# Patient Record
Sex: Male | Born: 1960 | Race: White | Hispanic: No | Marital: Married | Smoking: Never smoker
Health system: Southern US, Community
[De-identification: ages and names within clinical notes are randomized; demographics above are authoritative.]

## PROBLEM LIST (undated history)

## (undated) DIAGNOSIS — G629 Polyneuropathy, unspecified: Secondary | ICD-10-CM

## (undated) DIAGNOSIS — I219 Acute myocardial infarction, unspecified: Secondary | ICD-10-CM

## (undated) DIAGNOSIS — E119 Type 2 diabetes mellitus without complications: Secondary | ICD-10-CM

## (undated) DIAGNOSIS — F32A Depression, unspecified: Secondary | ICD-10-CM

## (undated) DIAGNOSIS — F329 Major depressive disorder, single episode, unspecified: Secondary | ICD-10-CM

## (undated) DIAGNOSIS — E785 Hyperlipidemia, unspecified: Secondary | ICD-10-CM

## (undated) DIAGNOSIS — E0590 Thyrotoxicosis, unspecified without thyrotoxic crisis or storm: Secondary | ICD-10-CM

## (undated) DIAGNOSIS — C8590 Non-Hodgkin lymphoma, unspecified, unspecified site: Secondary | ICD-10-CM

## (undated) DIAGNOSIS — R7989 Other specified abnormal findings of blood chemistry: Secondary | ICD-10-CM

## (undated) DIAGNOSIS — C801 Malignant (primary) neoplasm, unspecified: Secondary | ICD-10-CM

## (undated) HISTORY — DX: Type 2 diabetes mellitus without complications: E11.9

## (undated) HISTORY — PX: BRAIN SURGERY: SHX531

## (undated) HISTORY — DX: Major depressive disorder, single episode, unspecified: F32.9

---

## 2009-10-20 HISTORY — PX: CHOLECYSTECTOMY: SHX55

## 2017-01-22 DIAGNOSIS — R079 Chest pain, unspecified: Secondary | ICD-10-CM | POA: Diagnosis not present

## 2017-01-22 DIAGNOSIS — R55 Syncope and collapse: Secondary | ICD-10-CM | POA: Diagnosis not present

## 2017-01-23 DIAGNOSIS — R55 Syncope and collapse: Secondary | ICD-10-CM | POA: Diagnosis not present

## 2017-01-23 DIAGNOSIS — R079 Chest pain, unspecified: Secondary | ICD-10-CM | POA: Diagnosis not present

## 2017-04-09 ENCOUNTER — Other Ambulatory Visit (HOSPITAL_COMMUNITY)
Admission: RE | Admit: 2017-04-09 | Discharge: 2017-04-09 | Disposition: A | Discharge status: Home / Self Care | Payer: No Typology Code available for payment source | Source: Ambulatory Visit | Attending: Specialist | Admitting: Specialist

## 2017-04-09 DIAGNOSIS — C884 Extranodal marginal zone B-cell lymphoma of mucosa-associated lymphoid tissue [MALT-lymphoma]: Secondary | ICD-10-CM | POA: Diagnosis present

## 2017-04-15 DIAGNOSIS — C8339 Diffuse large B-cell lymphoma, extranodal and solid organ sites: Secondary | ICD-10-CM | POA: Diagnosis not present

## 2017-04-15 DIAGNOSIS — I951 Orthostatic hypotension: Secondary | ICD-10-CM

## 2017-04-21 DIAGNOSIS — R55 Syncope and collapse: Secondary | ICD-10-CM | POA: Diagnosis not present

## 2017-04-21 DIAGNOSIS — I361 Nonrheumatic tricuspid (valve) insufficiency: Secondary | ICD-10-CM | POA: Diagnosis not present

## 2017-04-23 ENCOUNTER — Ambulatory Visit: Payer: No Typology Code available for payment source | Admitting: Neurology

## 2017-04-23 ENCOUNTER — Encounter (HOSPITAL_COMMUNITY): Payer: Self-pay

## 2017-04-24 DIAGNOSIS — C8339 Diffuse large B-cell lymphoma, extranodal and solid organ sites: Secondary | ICD-10-CM | POA: Diagnosis not present

## 2017-04-30 LAB — TISSUE HYBRIDIZATION TO NCBH

## 2017-05-02 DIAGNOSIS — D709 Neutropenia, unspecified: Secondary | ICD-10-CM

## 2017-05-02 DIAGNOSIS — C8339 Diffuse large B-cell lymphoma, extranodal and solid organ sites: Secondary | ICD-10-CM | POA: Diagnosis not present

## 2017-05-02 DIAGNOSIS — E785 Hyperlipidemia, unspecified: Secondary | ICD-10-CM

## 2017-05-02 DIAGNOSIS — E1142 Type 2 diabetes mellitus with diabetic polyneuropathy: Secondary | ICD-10-CM | POA: Diagnosis not present

## 2017-05-02 DIAGNOSIS — R000 Tachycardia, unspecified: Secondary | ICD-10-CM

## 2017-05-02 DIAGNOSIS — D701 Agranulocytosis secondary to cancer chemotherapy: Secondary | ICD-10-CM | POA: Diagnosis not present

## 2017-05-02 DIAGNOSIS — I10 Essential (primary) hypertension: Secondary | ICD-10-CM

## 2017-05-02 DIAGNOSIS — C8333 Diffuse large B-cell lymphoma, intra-abdominal lymph nodes: Secondary | ICD-10-CM

## 2017-05-03 DIAGNOSIS — D709 Neutropenia, unspecified: Secondary | ICD-10-CM | POA: Diagnosis not present

## 2017-05-03 DIAGNOSIS — E1142 Type 2 diabetes mellitus with diabetic polyneuropathy: Secondary | ICD-10-CM | POA: Diagnosis not present

## 2017-05-03 DIAGNOSIS — I10 Essential (primary) hypertension: Secondary | ICD-10-CM | POA: Diagnosis not present

## 2017-05-03 DIAGNOSIS — C8333 Diffuse large B-cell lymphoma, intra-abdominal lymph nodes: Secondary | ICD-10-CM | POA: Diagnosis not present

## 2017-05-03 DIAGNOSIS — E785 Hyperlipidemia, unspecified: Secondary | ICD-10-CM | POA: Diagnosis not present

## 2017-05-04 DIAGNOSIS — I10 Essential (primary) hypertension: Secondary | ICD-10-CM | POA: Diagnosis not present

## 2017-05-04 DIAGNOSIS — E785 Hyperlipidemia, unspecified: Secondary | ICD-10-CM | POA: Diagnosis not present

## 2017-05-04 DIAGNOSIS — D709 Neutropenia, unspecified: Secondary | ICD-10-CM | POA: Diagnosis not present

## 2017-05-04 DIAGNOSIS — C8333 Diffuse large B-cell lymphoma, intra-abdominal lymph nodes: Secondary | ICD-10-CM | POA: Diagnosis not present

## 2017-05-05 DIAGNOSIS — C8339 Diffuse large B-cell lymphoma, extranodal and solid organ sites: Secondary | ICD-10-CM | POA: Diagnosis not present

## 2017-05-05 DIAGNOSIS — I10 Essential (primary) hypertension: Secondary | ICD-10-CM | POA: Diagnosis not present

## 2017-05-05 DIAGNOSIS — D709 Neutropenia, unspecified: Secondary | ICD-10-CM | POA: Diagnosis not present

## 2017-05-05 DIAGNOSIS — E785 Hyperlipidemia, unspecified: Secondary | ICD-10-CM | POA: Diagnosis not present

## 2017-05-05 DIAGNOSIS — D701 Agranulocytosis secondary to cancer chemotherapy: Secondary | ICD-10-CM

## 2017-05-05 DIAGNOSIS — C8333 Diffuse large B-cell lymphoma, intra-abdominal lymph nodes: Secondary | ICD-10-CM | POA: Diagnosis not present

## 2017-05-14 DIAGNOSIS — C8339 Diffuse large B-cell lymphoma, extranodal and solid organ sites: Secondary | ICD-10-CM | POA: Diagnosis not present

## 2017-05-14 DIAGNOSIS — G620 Drug-induced polyneuropathy: Secondary | ICD-10-CM | POA: Diagnosis not present

## 2017-05-20 ENCOUNTER — Ambulatory Visit (INDEPENDENT_AMBULATORY_CARE_PROVIDER_SITE_OTHER): Payer: BLUE CROSS/BLUE SHIELD | Admitting: Neurology

## 2017-05-20 ENCOUNTER — Encounter: Payer: Self-pay | Admitting: Neurology

## 2017-05-20 VITALS — BP 138/79 | HR 85 | Ht 74.0 in | Wt 227.0 lb

## 2017-05-20 DIAGNOSIS — R42 Dizziness and giddiness: Secondary | ICD-10-CM | POA: Diagnosis not present

## 2017-05-20 DIAGNOSIS — Z87898 Personal history of other specified conditions: Secondary | ICD-10-CM | POA: Diagnosis not present

## 2017-05-20 DIAGNOSIS — C8330 Diffuse large B-cell lymphoma, unspecified site: Secondary | ICD-10-CM

## 2017-05-20 NOTE — Patient Instructions (Signed)
Unfortunately, dizziness is a very common complaint but is often not due to a primary neurological reason or single underlying medical problem. Often, there a combination of factors, that result in dizziness. This includes blood pressure fluctuations, medication side effects, blood sugar fluctuations, stress, vertigo, poor sleep with sleep deprivation, dehydration, and electrolyte disturbance or other metabolic and endocrinological reasons, meaning hormone related problems such as thyroid dysfunction. We will investigate things further with a brain MRI with contrast. We will call you with the test results. You have evidence of peripheral neuropathy. Good diabetes control is going to be key for your, given, that you also have to be on steroids for your chemo.  Gabapentin can cause balance issues, keep that in mind.

## 2017-05-20 NOTE — Progress Notes (Signed)
Subjective:    Patient ID: Randy Carson is a 56 y.o. male.  HPI     Star Age, MD, PhD [facility] [address] [place], [place] [address]  Dear Dr. Tomma Lightning,   I saw your patient, Randy Carson, upon your kind request in my neurologic clinic today for initial consultation of his dizziness. The patient is accompanied by his wife today. 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 He does not necessarily feel a spinning sensation. He does not always feel lightheaded either. I reviewed your office note from 03/05/2017, which you kindly included. He was having high blood pressure values, recurrence of depression and anxiety. He was restarted on Zoloft at the time. You ordered a brain MRI. He had a brain MRI without contrast on 02/27/2017 which I reviewed: Impression: Age advanced but nonspecific FLAIR hyperintensity in the subcortical cerebral white matter. Differential considerations include sequelae of trauma, hypercoagulable state, vasculitis, prior infection, demyelination, or accelerated small vessel ischemia. Otherwise normal noncontrast MRI of the brain. He had a recent surgical consultation at [facility] with Dr. Noberto Retort for port placement for his chemotherapy. He had a PET CT FDG skull to thigh which showed through [facility] on 04/17/2017 which I reviewed: IMPRESSION: 1. Large intensely hypermetabolic invasive mass centered along the body of the stomach consistent with lymphoma. 2. Mass involves adjacent structures including the spleen, LEFT adrenal gland,  potentially LEFT kidney, pancreas, porta hepatis, LEFT crus of the diaphragm, and potentially RIGHT adrenal gland 3. No distant hypermetabolic adenopathy. 4. No evidence of skeletal involvement. 5. Moderate LEFT effusion  He had passing out spell on 01/22/17. He was seen by ENT and had a checkup for vertigo. He does admit that his blood sugar control is not very good, last A1c as far as he can remember was around 9.1. He does drink sweet tea several cups per day. Also coffee in the morning. He tries to drink enough water. He does not sleep very well. He snores. He does not wish to pursue a sleep study although I recommended sleep study testing for concern for sleep apnea. He does not wish to have a sleep study and he does not wish to have a CPAP machine. When he was 56 years old he was diagnosed with a brain tumor, could be a sinus tumor as the skull remains intact as I understand. He did have surgery at age 89 but no problems since then, no history of developmental delays. He has had 2 rounds of chemotherapy, total of 6 treatments planned every 3 weeks. He does have to take prednisone high dose for 5 days around his chemotherapy. He has a history of peripheral neuropathy secondary to diabetes and worsening symptoms recently after starting chemotherapy and his oncologist started him on gabapentin which patient feels has been helpful. He takes 300 mg 3 times a day. He was tried on meclizine which she did not think it was helpful.  His Past Medical History Is Significant For: Past Medical History:  Diagnosis Date  . Diabetes ([place])   . Major depressive disorder     His Past Surgical History Is Significant For: No past surgical  history on file.  His Family History Is Significant For: No family history on file.  His Social History Is Significant For: Social History   Social History  . Marital status: Married    Spouse name: N/A  . Number of children: N/A  . Years of education: N/A   Social  History Main Topics  . Smoking status: Never Smoker  . Smokeless tobacco: Never Used  . Alcohol use Yes  . Drug use: No  . Sexual activity: Not Asked   Other Topics Concern  . None   Social History Narrative  . None    His Allergies Are:  Allergies  Allergen Reactions  . Rosiglitazone Other (See Comments)  :   His Current Medications Are:  Outpatient Encounter Prescriptions as of 05/20/2017  Medication Sig  . amoxicillin-clavulanate (AUGMENTIN) 875-125 MG tablet Take 1 tablet by mouth 2 (two) times daily.  [place] Kitchen aspirin EC 81 MG tablet Take 81 mg by mouth daily.  . clotrimazole (LOTRIMIN) 1 % cream Apply 1 application topically 2 (two) times daily.  . fluconazole (DIFLUCAN) 100 MG tablet Take 100 mg by mouth daily.  Mar[place] Kitchen gabapentin (NEURONTIN) 300 MG capsule Take 300 mg by mouth 3 (three) times daily.  . insulin glargine (LANTUS) 100 UNIT/ML injection Inject 15 Units into the skin 2 (two) times daily.  [place] Kitchen lisinopril (PRINIVIL,ZESTRIL) 10 MG tablet Take 10 mg by mouth daily.  [place] Kitchen loratadine (CLARITIN) 10 MG tablet Take 10 mg by mouth daily.  [place] Kitchen lovastatin (MEVACOR) 20 MG tablet Take 20 mg by mouth at bedtime.  . metoprolol tartrate (LOPRESSOR) 25 MG tablet Take 25 mg by mouth 2 (two) times daily.  [place] Kitchen omeprazole (PRILOSEC) 20 MG capsule Take 20 mg by mouth 2 (two) times daily before a meal.  . PREDNISONE PO Take by mouth.  . sertraline (ZOLOFT) 50 MG tablet Take 50 mg by mouth daily.  . [DISCONTINUED] aspirin 325 MG tablet Take 325 mg by mouth daily.  . [DISCONTINUED] glipiZIDE (GLUCOTROL) 10 MG tablet Take 10 mg by mouth 2 (two) times daily before a meal.  . [DISCONTINUED] meclizine (ANTIVERT) 25 MG tablet Take 25 mg by mouth 3 (three) times daily as needed for dizziness.  . [DISCONTINUED] metFORMIN (GLUCOPHAGE) 1000 MG tablet Take 1,000 mg by mouth 2 (two) times daily with a meal.   No facility-administered encounter medications on file as of 05/20/2017.   :  Review of Systems:  Out of a  complete 14 point review of systems, all are reviewed and negative with the exception of these symptoms as listed below: Review of Systems  Neurological:       Pt presents today to discuss his dizziness. Pt experiences dizziness when he is standing or walking for a long amount of time. Pt brought an imaging CD with him to be reviewed if necessary. Pt was recently diagnosed with non-hodgkin's lymphoma.    Objective:  Neurological Exam  Physical Exam Physical Examination:   Vitals:   05/20/17 1011 05/20/17 1012  BP: 113/76 138/79  Pulse: (!) 102 85   General Examination: The patient is a very pleasant 56 y.o. male in no acute distress. He appears well-developed and well-nourished and adequately groomed. He did not have any orthostatic blood pressure drop or significant changes in his symptoms.  HEENT: Bony indentation in the forehead, with scarring above nasal bridge and along both eyebrows. Large concave deformity but skull bone is palpable underneath the lower frontal area. Pupils are equal, round and reactive to light  and accommodation. Extraocular tracking is good without limitation to gaze excursion or nystagmus noted. Normal smooth pursuit is noted. Hearing is grossly intact. Face is symmetric with normal facial animation and normal facial sensation. Speech is clear with no dysarthria noted. There is no hypophonia. There is no lip, neck/head, jaw or voice tremor. Neck is supple with full range of passive and active motion. There are no carotid bruits on auscultation. Oropharynx exam reveals: moderate mouth dryness, poor dental hygiene with multiple missing teeth, moderate airway crowding noted. Mallampati is class II. Tongue protrudes centrally and palate elevates symmetrically.   Chest: Clear to auscultation without wheezing, rhonchi or crackles noted.  Heart: S1+S2+0, regular and normal without murmurs, rubs or gallops noted.   Abdomen: Soft, non-tender and non-distended with normal  bowel sounds appreciated on auscultation.  Extremities: There is no pitting edema in the distal lower extremities bilaterally. Pedal pulses are intact.  Skin: Warm and dry without trophic changes noted. Patchy rash noted medial aspect of the left calf.  Musculoskeletal: exam reveals no obvious joint deformities, tenderness or joint swelling or erythema. Reports low back pain. Status post knee surgeries bilaterally, left knee slightly larger than right.  Neurologically:  Mental status: The patient is awake, alert and oriented in all 4 spheres. His immediate and remote memory, attention, language skills and fund of knowledge are appropriate. There is no evidence of aphasia, agnosia, apraxia or anomia. Speech is clear with normal prosody and enunciation. Thought process is linear. Mood is normal and affect is normal.  Cranial nerves II - XII are as described above under HEENT exam. In addition: shoulder shrug is normal with equal shoulder height noted. Motor exam: Normal bulk, strength and tone is noted. There is no drift, tremor or rebound. Romberg is negative, with the exception of sway. Reflexes are 1+ in the upper extremities, 2+ both knees, absent in both ankles. Toes are flexor bilaterally. Fine motor skills and coordination: intact with normal finger taps, normal hand movements, normal rapid alternating patting, normal foot taps and normal foot agility.  Cerebellar testing: No dysmetria or intention tremor on finger to nose testing. Heel to shin is unremarkable bilaterally. There is no truncal or gait ataxia.  Sensory exam: intact to light touch, pinprick, vibration, temperature sense in the upper extremities and proximal lower extremities but decrease to all modalities, right more than left in the distal lower extremities bilaterally, up to knees.   Gait, station and balance: He stands with difficulty. No veering to one side is noted. No leaning to one side is noted. Posture is age-appropriate and  stance is narrow based. he walks slowly, no limp, tandem walk is difficult for him.   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 Neurological exam is nonfocal with the exception of evidence of peripheral neuropathy, likely diabetic. He does report worsening of neuropathy symptoms since starting chemotherapy. He does have a history of poorly controlled diabetes, last A1c per his report was above 9. Especially in light of his cancer treatment involving high-dose prednisone, it is critical that he have optimal diabetes control. He is strongly advised to abstain from any sugary drinks or dietary  indiscretions. He is advised to stable hydrated. He declines coming in for a sleep study for concern for underlying obstructive sleep apnea, he does not wish to have a study or consider CPAP therapy. As far as the dizziness, this is a nonspecific complaints, I do not see any other focality on exam to explain his dizzy spell from the neurological standpoint. Nevertheless, given his prior cancer history or sinus tumor history as a child and recent diagnosis of lymphoma I would recommend a brain MRI with contrast. He had a noncontrasted MRI in May 2018 which did not show any focal neurological findings. We will call him with his MRI results once these are available. He is requesting [facility] in [place]. From my end of things I suggested as needed follow-up so long as the MRI is nonrevealing. I answered all their questions today and the patient and his wife were in agreement.  Thank you very much for allowing me to participate in  the care of this nice patient. If I can be of any further assistance to you please do not hesitate to call me at [phone].  Sincerely,   Star Age, MD, PhD

## 2017-05-25 ENCOUNTER — Telehealth: Payer: Self-pay | Admitting: Neurology

## 2017-05-25 NOTE — Telephone Encounter (Signed)
I was able to get the approval for the MRI brain w contrast but when I called [facility] to schedule the MRI they informed me per radiology  protocol in order to do a MRI brain w contrast the MRI Brain wo contrast had to be in the last 30 days. And since it has been longer than 30 days it needs to be a MRI Brain w/wo contrast. When you get a chance can you switch the order to a MRI brain w/wo contrast. Thank you

## 2017-05-25 NOTE — Addendum Note (Signed)
Addended by: Star Age on: 05/25/2017 11:39 AM   Modules accepted: Orders

## 2017-06-02 ENCOUNTER — Telehealth: Payer: Self-pay | Admitting: Neurology

## 2017-06-02 NOTE — Telephone Encounter (Signed)
Please advise patient that I have reviewed his brain MRI results from 05/26/2017. He had a brain MRI with and without contrast at [facility] in [place]. Findings were reported to be stable. He has no abnormal contrast uptake and no abnormal mass or finding. No acute stroke, no blood products, overall stable findings and in comparison with his brain MRI without contrast from May 2018 no new findings, overall reassuring.

## 2017-06-03 NOTE — Telephone Encounter (Signed)
I called pt. I advised him that his MRI brain showed stable findings, no abnormal contrast uptake and no abnormal stable findings and in comparison with his brain MRI without contrast from May 2018, there was no new findings which is overall reassuring. Pt verbalized understanding of results. Pt had no questions at this time but was encouraged to call back if questions arise.

## 2017-06-25 DIAGNOSIS — C8339 Diffuse large B-cell lymphoma, extranodal and solid organ sites: Secondary | ICD-10-CM | POA: Diagnosis not present

## 2017-07-17 DIAGNOSIS — C8339 Diffuse large B-cell lymphoma, extranodal and solid organ sites: Secondary | ICD-10-CM | POA: Diagnosis not present

## 2017-08-07 DIAGNOSIS — C8339 Diffuse large B-cell lymphoma, extranodal and solid organ sites: Secondary | ICD-10-CM | POA: Diagnosis not present

## 2017-09-17 DIAGNOSIS — C8339 Diffuse large B-cell lymphoma, extranodal and solid organ sites: Secondary | ICD-10-CM | POA: Diagnosis not present

## 2017-09-17 DIAGNOSIS — Z9221 Personal history of antineoplastic chemotherapy: Secondary | ICD-10-CM | POA: Diagnosis not present

## 2017-12-21 ENCOUNTER — Ambulatory Visit (INDEPENDENT_AMBULATORY_CARE_PROVIDER_SITE_OTHER): Payer: Non-veteran care | Admitting: Podiatry

## 2017-12-21 DIAGNOSIS — Q828 Other specified congenital malformations of skin: Secondary | ICD-10-CM | POA: Diagnosis not present

## 2017-12-21 DIAGNOSIS — B351 Tinea unguium: Secondary | ICD-10-CM

## 2017-12-21 DIAGNOSIS — E1142 Type 2 diabetes mellitus with diabetic polyneuropathy: Secondary | ICD-10-CM

## 2018-01-17 NOTE — Progress Notes (Signed)
Subjective:  Patient ID: Randy Carson, male    DOB: June 16, 1961,  MRN: [id number]  Chief Complaint  Patient presents with  . Diabetes    diabetic foot exam   . Nail Problem    fungal nails that are turning black   . Peripheral Neuropathy    neuropathy in both legs and feet   . Callouses    porokeratosis vs wart left heel     57 y.o. male presents with the above complaint.  Reports pain to the bottom of the left foot with these hard callused areas.  Reports neuropathy up to the legs.  Reports changes in his nails.  Unsure of last a.m. blood sugar.  Denies other pedal issues.  Denies nausea vomiting fever chills  Past Medical History:  Diagnosis Date  . Diabetes ([place])   . Major depressive disorder     Current Outpatient Medications:  .  aspirin EC 81 MG tablet, Take 81 mg by mouth daily., Disp: , Rfl:  .  gabapentin (NEURONTIN) 300 MG capsule, Take 300 mg by mouth 3 (three) times daily., Disp: , Rfl: 0 .  insulin glargine (LANTUS) 100 UNIT/ML injection, Inject 15 Units into the skin 2 (two) times daily., Disp: , Rfl:  .  lovastatin (MEVACOR) 20 MG tablet, Take 20 mg by mouth at bedtime., Disp: , Rfl:  .  metFORMIN (GLUCOPHAGE) 1000 MG tablet, Take by mouth., Disp: , Rfl:  .  metoprolol tartrate (LOPRESSOR) 25 MG tablet, Take 25 mg by mouth 2 (two) times daily., Disp: , Rfl:  .  omeprazole (PRILOSEC) 20 MG capsule, Take 20 mg by mouth 2 (two) times daily before a meal., Disp: , Rfl:  .  sertraline (ZOLOFT) 50 MG tablet, Take 50 mg by mouth daily., Disp: , Rfl:  .  amoxicillin-clavulanate (AUGMENTIN) 875-125 MG tablet, Take 1 tablet by mouth 2 (two) times daily., Disp: , Rfl:  .  clotrimazole (LOTRIMIN) 1 % cream, Apply 1 application topically 2 (two) times daily., Disp: , Rfl:  .  fluconazole (DIFLUCAN) 100 MG tablet, Take 100 mg by mouth daily., Disp: , Rfl:  .  lisinopril (PRINIVIL,ZESTRIL) 10 MG tablet, Take 10 mg by mouth daily., Disp: , Rfl:  .  loratadine (CLARITIN) 10  MG tablet, Take 10 mg by mouth daily., Disp: , Rfl:  .  PREDNISONE PO, Take by mouth., Disp: , Rfl:   Allergies  Allergen Reactions  . Rosiglitazone Other (See Comments)   Review of Systems Objective:   General AA&O x3. Normal mood and affect.  Vascular Dorsalis pedis pulses present 1+ bilaterally  Posterior tibial pulses 1+ bilaterally  Capillary refill normal to all digits. Pedal hair growth normal.  Neurologic Epicritic sensation present bilaterally. Protective sensation with 5.07 monofilament  Absent bilaterally. Vibratory sensation present bilaterally.  Dermatologic No open lesions. Interspaces clear of maceration.  Normal skin temperature and turgor. Hyperkeratotic lesions: L 5th met, R 2nd met, L heel Nails: brittle, onychomycosis, thickening, elongation  Orthopedic: No history of amputation. MMT 5/5 in dorsiflexion, plantarflexion, inversion, and eversion. Normal lower extremity joint ROM without pain or crepitus.     Assessment & Plan:  Patient was evaluated and treated and all questions answered.  DM with DPN, onychomycosis, porokeratosis -Educated on DM foot care -At risk foot care provider as below  Procedure: Paring of Lesion Rationale: painful hyperkeratotic lesion Type of Debridement: manual, sharp debridement. Instrumentation: 312 blade Number of Lesions: 3  Procedure: Nail Debridement Rationale: Patient meets criteria for routine foot care  due to 1 Class B, 2 class C findings. Type of Debridement: manual, sharp debridement. Instrumentation: Nail nipper, rotary burr. Number of Nails: 10     Return in about 6 weeks (around 02/01/2018).

## 2018-02-01 ENCOUNTER — Ambulatory Visit (INDEPENDENT_AMBULATORY_CARE_PROVIDER_SITE_OTHER): Payer: Non-veteran care | Admitting: Podiatry

## 2018-02-01 DIAGNOSIS — E1142 Type 2 diabetes mellitus with diabetic polyneuropathy: Secondary | ICD-10-CM

## 2018-02-01 DIAGNOSIS — B351 Tinea unguium: Secondary | ICD-10-CM

## 2018-02-01 DIAGNOSIS — L309 Dermatitis, unspecified: Secondary | ICD-10-CM

## 2018-02-01 MED ORDER — TRIAMCINOLONE ACETONIDE 0.1 % EX CREA: TOPICAL_CREAM | CUTANEOUS | 0 refills | Status: DC

## 2018-02-01 NOTE — Progress Notes (Signed)
  Subjective:  Patient ID: Randy Carson, male    DOB: Mar 08, 1961,  MRN: [id number]  Chief Complaint  Patient presents with  . foot exam    DM foot exam -sugar: 150 A1C: " don;t know" [facility]  . Plantar Warts    F/U L bottom heel wart Pt. stated," no pain so it's doing much better." Tx: none   57 y.o. male returns for diabetic foot care. Last AMBS was 132.   New complaint of rash to the left leg Objective:  There were no vitals filed for this visit. General AA&O x3. Normal mood and affect.  Vascular Dorsalis pedis pulses present 2+ bilaterally  Posterior tibial pulses present 1+ bilaterally  Capillary refill normal to all digits. Pedal hair growth diminished.  Neurologic Epicritic sensation present bilaterally. Protective sensation with 5.07 monofilament  absent bilaterally.  Dermatologic  Pruritic vesicular lesion L leg Interspaces clear of maceration.  Normal skin temperature and turgor. Hyperkeratotic lesions: none bilaterally. Nails: brittle, irregular, wavy nails, onychomycosis, thickening  Orthopedic: No history of amputation. MMT 5/5 in dorsiflexion, plantarflexion, inversion, and eversion. Normal lower extremity joint ROM without pain or crepitus.   Assessment & Plan:  Patient was evaluated and treated and all questions answered.  Diabetes with DPN, Onychomycosis -Educated on diabetic footcare. Diabetic risk level 1 -At risk foot care provided as below.  Procedure: Nail Debridement Rationale: Patient meets criteria for routine foot care due to 1 class B, 2 Class C findings. Type of Debridement: manual, sharp debridement. Instrumentation: Nail nipper, rotary burr. Number of Nails: 10  L Leg Pruritic Rash -Rx Triamcinolone cream.  Return in about 6 weeks (around 03/15/2018).

## 2018-03-22 ENCOUNTER — Ambulatory Visit (INDEPENDENT_AMBULATORY_CARE_PROVIDER_SITE_OTHER): Payer: No Typology Code available for payment source | Admitting: Podiatry

## 2018-03-22 ENCOUNTER — Encounter: Payer: Self-pay | Admitting: Podiatry

## 2018-03-22 DIAGNOSIS — L309 Dermatitis, unspecified: Secondary | ICD-10-CM | POA: Diagnosis not present

## 2018-03-22 MED ORDER — TRIAMCINOLONE ACETONIDE 0.1 % EX CREA: TOPICAL_CREAM | CUTANEOUS | 0 refills | Status: AC

## 2018-03-22 MED ORDER — TRIAMCINOLONE ACETONIDE 0.1 % EX CREA: TOPICAL_CREAM | CUTANEOUS | 0 refills | Status: DC

## 2018-04-18 NOTE — Progress Notes (Signed)
  Subjective:  Patient ID: Randy Carson, male    DOB: 1961-05-11,  MRN: [id number]  Chief Complaint  Patient presents with  . Rash    left inner leg, forgot to pick up prescription, please resend,no changes in rash   57 y.o. male returns for follow-up.  States that the same has not picked up his prescription. Objective:  There were no vitals filed for this visit. General AA&O x3. Normal mood and affect.  Vascular Dorsalis pedis pulses present 2+ bilaterally  Posterior tibial pulses present 1+ bilaterally  Capillary refill normal to all digits. Pedal hair growth diminished.  Neurologic Epicritic sensation present bilaterally. Protective sensation with 5.07 monofilament  absent bilaterally.  Dermatologic Pruritic vesicular lesion L leg Interspaces clear of maceration.  Normal skin temperature and turgor. Hyperkeratotic lesions: none bilaterally. Nails: brittle, irregular, wavy nails, onychomycosis, thickening  Orthopedic: No history of amputation. MMT 5/5 in dorsiflexion, plantarflexion, inversion, and eversion. Normal lower extremity joint ROM without pain or crepitus.   Assessment & Plan:  Patient was evaluated and treated and all questions answered.   L Leg Pruritic Rash -Refill Triamcinolone cream.  Return in about 1 month (around 04/19/2018).

## 2018-04-19 ENCOUNTER — Ambulatory Visit (INDEPENDENT_AMBULATORY_CARE_PROVIDER_SITE_OTHER): Payer: No Typology Code available for payment source | Admitting: Podiatry

## 2018-04-19 ENCOUNTER — Telehealth: Payer: Self-pay | Admitting: Podiatry

## 2018-04-19 DIAGNOSIS — B353 Tinea pedis: Secondary | ICD-10-CM | POA: Diagnosis not present

## 2018-04-19 DIAGNOSIS — B351 Tinea unguium: Secondary | ICD-10-CM | POA: Diagnosis not present

## 2018-04-19 DIAGNOSIS — E1142 Type 2 diabetes mellitus with diabetic polyneuropathy: Secondary | ICD-10-CM | POA: Diagnosis not present

## 2018-04-19 MED ORDER — KETOCONAZOLE 2 % EX CREA: TOPICAL_CREAM | CUTANEOUS | 0 refills | Status: DC

## 2018-04-19 NOTE — Telephone Encounter (Signed)
Called Rx to [facility] pharmacy. I attempted to call the number pt left for me, but there was only a busy signal when dialed. I looked up online the information below and went ahead and called in ketoconazole cream 2% as requested. Informed pt-L/M   [place] is [place], , [place], [place] number is [phone] and fax number is [phone]

## 2018-04-19 NOTE — Progress Notes (Signed)
  Subjective:  Patient ID: Randy Carson, male    DOB: [birth year],  MRN: [id number]  Chief Complaint  Patient presents with  . debride    B/L nail trimming -FBS: 152 A1C: NA [facility]  . Rash    F/U L leg rash Pt. states," tx is not helping at all, it looks the same." Tx triamcinolone cream   57 y.o. male returns for diabetic foot care. Last AMBS was 152. No improvement with the cream on the rash to the L leg. Objective:  There were no vitals filed for this visit. General AA&O x3. Normal mood and affect.  Vascular Dorsalis pedis pulses present 2+ bilaterally  Posterior tibial pulses present 1+ bilaterally  Capillary refill normal to all digits. Pedal hair growth diminished.  Neurologic Epicritic sensation present bilaterally. Protective sensation with 5.07 monofilament  absent bilaterally.  Dermatologic Annular lesion L leg Interspaces clear of maceration.  Normal skin temperature and turgor. Hyperkeratotic lesions: none bilaterally. Nails: brittle, irregular, wavy nails, onychomycosis, thickening  Orthopedic: No history of amputation. MMT 5/5 in dorsiflexion, plantarflexion, inversion, and eversion. Normal lower extremity joint ROM without pain or crepitus.   Assessment & Plan:  Patient was evaluated and treated and all questions answered.  Diabetes with DPN, Onychomycosis -Educated on diabetic footcare. Diabetic risk level 1 -At risk foot care provided as below.  Procedure: Nail Debridement Rationale: Patient meets criteria for routine foot care due to 1 Class B, 2 class C findings. Type of Debridement: manual, sharp debridement. Instrumentation: Nail nipper, rotary burr. Number of Nails: 10   L Leg Ringworm -Rx Ketoconazole.  Return in about 3 months (around 07/20/2018) for Diabetic Foot Care.

## 2018-04-19 NOTE — Telephone Encounter (Signed)
Pt called and stated that the antifungal cream that was sent over to [facility] cost to much. Pt is currently out of work. He wanted to know could the medication be sent over to the [facility] in [place]. Please give pt a call.   [address], [place], [place] [address]  Phone [phone]

## 2018-04-26 ENCOUNTER — Telehealth: Payer: Self-pay | Admitting: *Deleted

## 2018-04-26 MED ORDER — KETOCONAZOLE 2 % EX CREA: TOPICAL_CREAM | CUTANEOUS | 0 refills | Status: AC

## 2018-04-26 NOTE — Telephone Encounter (Signed)
Pt request anti-fungal medication be faxed to the [facility] [phone]. Faxed Ketoconazole cream 2% to [facility].

## 2018-05-18 ENCOUNTER — Telehealth: Payer: Self-pay | Admitting: Podiatry

## 2018-05-18 NOTE — Telephone Encounter (Signed)
[facility] in March,  faxed to [phone].

## 2018-07-20 ENCOUNTER — Ambulatory Visit: Payer: Non-veteran care | Admitting: Podiatry

## 2018-10-11 ENCOUNTER — Inpatient Hospital Stay (HOSPITAL_COMMUNITY): Payer: No Typology Code available for payment source

## 2018-10-11 ENCOUNTER — Emergency Department (HOSPITAL_COMMUNITY): Payer: No Typology Code available for payment source

## 2018-10-11 ENCOUNTER — Inpatient Hospital Stay (HOSPITAL_COMMUNITY)
Admission: EM | Admit: 2018-10-11 | Discharge: 2018-12-02 | DRG: 082 | Disposition: A | Discharge status: Home Health | Payer: No Typology Code available for payment source | Attending: General Surgery | Admitting: General Surgery

## 2018-10-11 DIAGNOSIS — S065X9A Traumatic subdural hemorrhage with loss of consciousness of unspecified duration, initial encounter: Principal | ICD-10-CM | POA: Diagnosis present

## 2018-10-11 DIAGNOSIS — S8010XA Contusion of unspecified lower leg, initial encounter: Secondary | ICD-10-CM | POA: Diagnosis present

## 2018-10-11 DIAGNOSIS — S2220XA Unspecified fracture of sternum, initial encounter for closed fracture: Secondary | ICD-10-CM | POA: Diagnosis present

## 2018-10-11 DIAGNOSIS — Z79890 Hormone replacement therapy (postmenopausal): Secondary | ICD-10-CM

## 2018-10-11 DIAGNOSIS — E1165 Type 2 diabetes mellitus with hyperglycemia: Secondary | ICD-10-CM | POA: Diagnosis present

## 2018-10-11 DIAGNOSIS — S069X3S Unspecified intracranial injury with loss of consciousness of 1 hour to 5 hours 59 minutes, sequela: Secondary | ICD-10-CM | POA: Diagnosis not present

## 2018-10-11 DIAGNOSIS — R402122 Coma scale, eyes open, to pain, at arrival to emergency department: Secondary | ICD-10-CM | POA: Diagnosis present

## 2018-10-11 DIAGNOSIS — R402212 Coma scale, best verbal response, none, at arrival to emergency department: Secondary | ICD-10-CM | POA: Diagnosis present

## 2018-10-11 DIAGNOSIS — D62 Acute posthemorrhagic anemia: Secondary | ICD-10-CM | POA: Diagnosis present

## 2018-10-11 DIAGNOSIS — I10 Essential (primary) hypertension: Secondary | ICD-10-CM | POA: Diagnosis present

## 2018-10-11 DIAGNOSIS — Z4659 Encounter for fitting and adjustment of other gastrointestinal appliance and device: Secondary | ICD-10-CM

## 2018-10-11 DIAGNOSIS — S02119A Unspecified fracture of occiput, initial encounter for closed fracture: Secondary | ICD-10-CM | POA: Diagnosis present

## 2018-10-11 DIAGNOSIS — R633 Feeding difficulties: Secondary | ICD-10-CM | POA: Diagnosis not present

## 2018-10-11 DIAGNOSIS — R402424 Glasgow coma scale score 9-12, 24 hours or more after hospital admission: Secondary | ICD-10-CM | POA: Diagnosis not present

## 2018-10-11 DIAGNOSIS — Z888 Allergy status to other drugs, medicaments and biological substances status: Secondary | ICD-10-CM

## 2018-10-11 DIAGNOSIS — Z85841 Personal history of malignant neoplasm of brain: Secondary | ICD-10-CM | POA: Diagnosis not present

## 2018-10-11 DIAGNOSIS — E785 Hyperlipidemia, unspecified: Secondary | ICD-10-CM | POA: Diagnosis present

## 2018-10-11 DIAGNOSIS — R509 Fever, unspecified: Secondary | ICD-10-CM

## 2018-10-11 DIAGNOSIS — R4020 Unspecified coma: Secondary | ICD-10-CM | POA: Diagnosis present

## 2018-10-11 DIAGNOSIS — Z781 Physical restraint status: Secondary | ICD-10-CM | POA: Diagnosis not present

## 2018-10-11 DIAGNOSIS — N390 Urinary tract infection, site not specified: Secondary | ICD-10-CM | POA: Diagnosis not present

## 2018-10-11 DIAGNOSIS — E1140 Type 2 diabetes mellitus with diabetic neuropathy, unspecified: Secondary | ICD-10-CM | POA: Diagnosis present

## 2018-10-11 DIAGNOSIS — F329 Major depressive disorder, single episode, unspecified: Secondary | ICD-10-CM | POA: Diagnosis present

## 2018-10-11 DIAGNOSIS — L8990 Pressure ulcer of unspecified site, unspecified stage: Secondary | ICD-10-CM

## 2018-10-11 DIAGNOSIS — S066X9A Traumatic subarachnoid hemorrhage with loss of consciousness of unspecified duration, initial encounter: Secondary | ICD-10-CM | POA: Diagnosis present

## 2018-10-11 DIAGNOSIS — B9620 Unspecified Escherichia coli [E. coli] as the cause of diseases classified elsewhere: Secondary | ICD-10-CM | POA: Diagnosis not present

## 2018-10-11 DIAGNOSIS — J9601 Acute respiratory failure with hypoxia: Secondary | ICD-10-CM | POA: Diagnosis present

## 2018-10-11 DIAGNOSIS — Y92410 Unspecified street and highway as the place of occurrence of the external cause: Secondary | ICD-10-CM | POA: Diagnosis not present

## 2018-10-11 DIAGNOSIS — C8510 Unspecified B-cell lymphoma, unspecified site: Secondary | ICD-10-CM | POA: Diagnosis present

## 2018-10-11 DIAGNOSIS — M79671 Pain in right foot: Secondary | ICD-10-CM | POA: Diagnosis not present

## 2018-10-11 DIAGNOSIS — I252 Old myocardial infarction: Secondary | ICD-10-CM | POA: Diagnosis not present

## 2018-10-11 DIAGNOSIS — R402352 Coma scale, best motor response, localizes pain, at arrival to emergency department: Secondary | ICD-10-CM | POA: Diagnosis present

## 2018-10-11 DIAGNOSIS — Z794 Long term (current) use of insulin: Secondary | ICD-10-CM

## 2018-10-11 DIAGNOSIS — J189 Pneumonia, unspecified organism: Secondary | ICD-10-CM

## 2018-10-11 DIAGNOSIS — Z978 Presence of other specified devices: Secondary | ICD-10-CM

## 2018-10-11 DIAGNOSIS — I6200 Nontraumatic subdural hemorrhage, unspecified: Secondary | ICD-10-CM

## 2018-10-11 DIAGNOSIS — Z79899 Other long term (current) drug therapy: Secondary | ICD-10-CM

## 2018-10-11 DIAGNOSIS — J9690 Respiratory failure, unspecified, unspecified whether with hypoxia or hypercapnia: Secondary | ICD-10-CM

## 2018-10-11 DIAGNOSIS — K9423 Gastrostomy malfunction: Secondary | ICD-10-CM

## 2018-10-11 HISTORY — DX: Other specified abnormal findings of blood chemistry: R79.89

## 2018-10-11 HISTORY — DX: Depression, unspecified: F32.A

## 2018-10-11 HISTORY — DX: Non-Hodgkin lymphoma, unspecified, unspecified site: C85.90

## 2018-10-11 HISTORY — DX: Hyperlipidemia, unspecified: E78.5

## 2018-10-11 HISTORY — DX: Major depressive disorder, single episode, unspecified: F32.9

## 2018-10-11 HISTORY — DX: Thyrotoxicosis, unspecified without thyrotoxic crisis or storm: E05.90

## 2018-10-11 HISTORY — DX: Acute myocardial infarction, unspecified: I21.9

## 2018-10-11 HISTORY — DX: Malignant (primary) neoplasm, unspecified: C80.1

## 2018-10-11 HISTORY — DX: Type 2 diabetes mellitus without complications: E11.9

## 2018-10-11 HISTORY — DX: Polyneuropathy, unspecified: G62.9

## 2018-10-11 LAB — COMPREHENSIVE METABOLIC PANEL
ALK PHOS: 57 U/L (ref 38–126)
ALT: 29 U/L (ref 0–44)
AST: 30 U/L (ref 15–41)
Albumin: 3.8 g/dL (ref 3.5–5.0)
Anion gap: 10 (ref 5–15)
BILIRUBIN TOTAL: 0.7 mg/dL (ref 0.3–1.2)
BUN: 16 mg/dL (ref 6–20)
CALCIUM: 9 mg/dL (ref 8.9–10.3)
CO2: 21 mmol/L — ABNORMAL LOW (ref 22–32)
Chloride: 102 mmol/L (ref 98–111)
Creatinine, Ser: 1.04 mg/dL (ref 0.61–1.24)
GFR calc Af Amer: >60 mL/min (ref >60)
GFR calc non Af Amer: >60 mL/min (ref >60)
Glucose, Bld: 230 mg/dL — ABNORMAL HIGH (ref 70–99)
Potassium: 5.4 mmol/L — ABNORMAL HIGH (ref 3.5–5.1)
Sodium: 133 mmol/L — ABNORMAL LOW (ref 135–145)
Total Protein: 7.2 g/dL (ref 6.5–8.1)

## 2018-10-11 LAB — URINALYSIS, ROUTINE W REFLEX MICROSCOPIC
Bilirubin Urine: NEGATIVE
Glucose, UA: >=500 mg/dL — AB
Ketones, ur: NEGATIVE mg/dL
Leukocytes, UA: NEGATIVE
Nitrite: NEGATIVE
Protein, ur: 30 mg/dL — AB
Specific Gravity, Urine: 1.01 (ref 1.005–1.030)
pH: 5 (ref 5.0–8.0)

## 2018-10-11 LAB — I-STAT CHEM 8, ED
BUN: 20 mg/dL (ref 6–20)
Calcium, Ion: 1.16 mmol/L (ref 1.15–1.40)
Chloride: 100 mmol/L (ref 98–111)
Creatinine, Ser: 1 mg/dL (ref 0.61–1.24)
GLUCOSE: 237 mg/dL — AB (ref 70–99)
HCT: 41 % (ref 39.0–52.0)
Hemoglobin: 13.9 g/dL (ref 13.0–17.0)
Potassium: 5.5 mmol/L — ABNORMAL HIGH (ref 3.5–5.1)
Sodium: 133 mmol/L — ABNORMAL LOW (ref 135–145)
TCO2: 26 mmol/L (ref 22–32)

## 2018-10-11 LAB — TYPE AND SCREEN
ABO/RH(D): A POS
Antibody Screen: NEGATIVE
Unit division: 0
Unit division: 0

## 2018-10-11 LAB — I-STAT ARTERIAL BLOOD GAS, ED
Acid-base deficit: 4 mmol/L — ABNORMAL HIGH (ref 0.0–2.0)
Bicarbonate: 21.1 mmol/L (ref 20.0–28.0)
O2 Saturation: 100 %
Patient temperature: 98.6
TCO2: 22 mmol/L (ref 22–32)
pCO2 arterial: 39.2 mmHg (ref 32.0–48.0)
pH, Arterial: 7.338 — ABNORMAL LOW (ref 7.350–7.450)
pO2, Arterial: 520 mmHg — ABNORMAL HIGH (ref 83.0–108.0)

## 2018-10-11 LAB — PREPARE FRESH FROZEN PLASMA
Unit division: 0
Unit division: 0

## 2018-10-11 LAB — BPAM RBC
BLOOD PRODUCT EXPIRATION DATE: 202001162359
Blood Product Expiration Date: 202001162359
ISSUE DATE / TIME: 201912231249
ISSUE DATE / TIME: 201912231249
Unit Type and Rh: 5100
Unit Type and Rh: 5100

## 2018-10-11 LAB — CBC
HCT: 41.8 % (ref 39.0–52.0)
Hemoglobin: 13.9 g/dL (ref 13.0–17.0)
MCH: 29.8 pg (ref 26.0–34.0)
MCHC: 33.3 g/dL (ref 30.0–36.0)
MCV: 89.5 fL (ref 80.0–100.0)
PLATELETS: 251 10*3/uL (ref 150–400)
RBC: 4.67 MIL/uL (ref 4.22–5.81)
RDW: 12.7 % (ref 11.5–15.5)
WBC: 11.2 10*3/uL — ABNORMAL HIGH (ref 4.0–10.5)
nRBC: 0 % (ref 0.0–0.2)

## 2018-10-11 LAB — BPAM FFP
BLOOD PRODUCT EXPIRATION DATE: 201912262359
BLOOD PRODUCT EXPIRATION DATE: 201912262359
ISSUE DATE / TIME: 201912231249
ISSUE DATE / TIME: 201912231249
Unit Type and Rh: 6200
Unit Type and Rh: 6200

## 2018-10-11 LAB — URINALYSIS, MICROSCOPIC (REFLEX)

## 2018-10-11 LAB — I-STAT CG4 LACTIC ACID, ED: Lactic Acid, Venous: 2.86 mmol/L (ref 0.5–1.9)

## 2018-10-11 LAB — ABO/RH: ABO/RH(D): A POS

## 2018-10-11 LAB — ETHANOL: Alcohol, Ethyl (B): <10 mg/dL (ref <10)

## 2018-10-11 LAB — CBG MONITORING, ED: Glucose-Capillary: 219 mg/dL — ABNORMAL HIGH (ref 70–99)

## 2018-10-11 LAB — PROTIME-INR
INR: 1.06
Prothrombin Time: 13.7 seconds (ref 11.4–15.2)

## 2018-10-11 LAB — GLUCOSE, CAPILLARY: Glucose-Capillary: 147 mg/dL — ABNORMAL HIGH (ref 70–99)

## 2018-10-11 LAB — CDS SEROLOGY

## 2018-10-11 LAB — MRSA PCR SCREENING: MRSA by PCR: NEGATIVE

## 2018-10-11 LAB — TRIGLYCERIDES: Triglycerides: 578 mg/dL — ABNORMAL HIGH (ref <150)

## 2018-10-11 MED ORDER — FENTANYL CITRATE (PF) 100 MCG/2ML IJ SOLN
100.0000 ug | INTRAMUSCULAR | Status: DC | PRN
  Administered 2018-10-12 – 2018-10-15 (×4): 100 ug via INTRAVENOUS
  Filled 2018-10-11 (×4): qty 2

## 2018-10-11 MED ORDER — PROPOFOL 1000 MG/100ML IV EMUL: 0.0000 ug/kg/min | INTRAVENOUS | Status: DC

## 2018-10-11 MED ORDER — DOCUSATE SODIUM 50 MG/5ML PO LIQD
100.0000 mg | Freq: Two times a day (BID) | ORAL | Status: DC | PRN
  Administered 2018-10-18: 100 mg
  Filled 2018-10-11: qty 10

## 2018-10-11 MED ORDER — ONDANSETRON HCL 4 MG/2ML IJ SOLN: 4.0000 mg | Freq: Four times a day (QID) | INTRAMUSCULAR | Status: DC | PRN

## 2018-10-11 MED ORDER — HYDRALAZINE HCL 20 MG/ML IJ SOLN: 10.0000 mg | INTRAMUSCULAR | Status: DC | PRN

## 2018-10-11 MED ORDER — PROPOFOL 1000 MG/100ML IV EMUL
0.0000 ug/kg/min | INTRAVENOUS | Status: DC
  Administered 2018-10-11: 50 ug/kg/min via INTRAVENOUS
  Administered 2018-10-11: 40 ug/kg/min via INTRAVENOUS
  Administered 2018-10-11: 30 ug/kg/min via INTRAVENOUS
  Administered 2018-10-11: 50 ug/kg/min via INTRAVENOUS
  Administered 2018-10-12: 10 ug/kg/min via INTRAVENOUS
  Administered 2018-10-12 (×2): 30 ug/kg/min via INTRAVENOUS
  Administered 2018-10-13: 10 ug/kg/min via INTRAVENOUS
  Administered 2018-10-16: 5 ug/kg/min via INTRAVENOUS
  Filled 2018-10-11 (×7): qty 100

## 2018-10-11 MED ORDER — ACETAMINOPHEN 160 MG/5ML PO SOLN
650.0000 mg | Freq: Four times a day (QID) | ORAL | Status: DC | PRN
  Administered 2018-10-11 – 2018-10-16 (×14): 650 mg
  Filled 2018-10-11 (×14): qty 20.3

## 2018-10-11 MED ORDER — NALOXONE HCL 2 MG/2ML IJ SOSY
PREFILLED_SYRINGE | INTRAMUSCULAR | Status: AC | PRN
  Administered 2018-10-11: 1 mg via INTRAVENOUS

## 2018-10-11 MED ORDER — SUCCINYLCHOLINE CHLORIDE 20 MG/ML IJ SOLN
INTRAMUSCULAR | Status: AC | PRN
  Administered 2018-10-11: 100 mg via INTRAVENOUS

## 2018-10-11 MED ORDER — NALOXONE HCL 2 MG/2ML IJ SOSY
PREFILLED_SYRINGE | INTRAMUSCULAR | Status: AC
  Filled 2018-10-11: qty 2

## 2018-10-11 MED ORDER — INSULIN ASPART 100 UNIT/ML ~~LOC~~ SOLN
0.0000 [IU] | SUBCUTANEOUS | Status: DC
  Administered 2018-10-11: 2 [IU] via SUBCUTANEOUS
  Administered 2018-10-12 (×2): 5 [IU] via SUBCUTANEOUS
  Administered 2018-10-12: 3 [IU] via SUBCUTANEOUS
  Administered 2018-10-12 (×2): 5 [IU] via SUBCUTANEOUS

## 2018-10-11 MED ORDER — ACETAMINOPHEN 325 MG PO TABS
650.0000 mg | ORAL_TABLET | Freq: Once | ORAL | Status: AC
  Administered 2018-10-11: 650 mg
  Filled 2018-10-11: qty 2

## 2018-10-11 MED ORDER — FENTANYL CITRATE (PF) 100 MCG/2ML IJ SOLN
100.0000 ug | INTRAMUSCULAR | Status: AC | PRN
  Administered 2018-10-12 – 2018-10-15 (×3): 100 ug via INTRAVENOUS
  Filled 2018-10-11 (×3): qty 2

## 2018-10-11 MED ORDER — ETOMIDATE 2 MG/ML IV SOLN
INTRAVENOUS | Status: AC | PRN
  Administered 2018-10-11: 20 mg via INTRAVENOUS

## 2018-10-11 MED ORDER — PROPOFOL 1000 MG/100ML IV EMUL
INTRAVENOUS | Status: AC
  Filled 2018-10-11: qty 100

## 2018-10-11 MED ORDER — PROPOFOL 1000 MG/100ML IV EMUL
INTRAVENOUS | Status: AC | PRN
  Administered 2018-10-11: 20 ug/kg/min via INTRAVENOUS

## 2018-10-11 MED ORDER — ONDANSETRON 4 MG PO TBDP: 4.0000 mg | ORAL_TABLET | Freq: Four times a day (QID) | ORAL | Status: DC | PRN

## 2018-10-11 MED ORDER — POTASSIUM CHLORIDE IN NACL 20-0.9 MEQ/L-% IV SOLN
INTRAVENOUS | Status: DC
  Administered 2018-10-11 – 2018-10-12 (×3): via INTRAVENOUS
  Administered 2018-10-13: 100 mL/h via INTRAVENOUS
  Administered 2018-10-14 – 2018-10-17 (×10): via INTRAVENOUS
  Filled 2018-10-11 (×16): qty 1000

## 2018-10-11 MED ORDER — IOHEXOL 300 MG/ML  SOLN
100.0000 mL | Freq: Once | INTRAMUSCULAR | Status: AC | PRN
  Administered 2018-10-11: 100 mL via INTRAVENOUS

## 2018-10-11 NOTE — ED Notes (Signed)
Dr. Laverta [place] applying staples to posterior head lacerations
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
## 2018-10-11 NOTE — ED Notes (Signed)
Bear hugger turned off per temperature.
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
## 2018-10-12 ENCOUNTER — Inpatient Hospital Stay (HOSPITAL_COMMUNITY): Payer: No Typology Code available for payment source

## 2018-10-12 LAB — BASIC METABOLIC PANEL
Anion gap: 15 (ref 5–15)
BUN: 15 mg/dL (ref 6–20)
CALCIUM: 8.6 mg/dL — AB (ref 8.9–10.3)
CO2: 20 mmol/L — ABNORMAL LOW (ref 22–32)
Chloride: 98 mmol/L (ref 98–111)
Creatinine, Ser: 1.02 mg/dL (ref 0.61–1.24)
Glucose, Bld: 182 mg/dL — ABNORMAL HIGH (ref 70–99)
Potassium: 4.9 mmol/L (ref 3.5–5.1)
Sodium: 133 mmol/L — ABNORMAL LOW (ref 135–145)

## 2018-10-12 LAB — HIV ANTIBODY (ROUTINE TESTING W REFLEX): HIV Screen 4th Generation wRfx: NONREACTIVE

## 2018-10-12 LAB — TRIGLYCERIDES: Triglycerides: 699 mg/dL — ABNORMAL HIGH (ref <150)

## 2018-10-12 LAB — CBC
HCT: 38.5 % — ABNORMAL LOW (ref 39.0–52.0)
Hemoglobin: 13.6 g/dL (ref 13.0–17.0)
MCH: 31.2 pg (ref 26.0–34.0)
MCHC: 35.3 g/dL (ref 30.0–36.0)
MCV: 88.3 fL (ref 80.0–100.0)
Platelets: 242 10*3/uL (ref 150–400)
RBC: 4.36 MIL/uL (ref 4.22–5.81)
RDW: 12.7 % (ref 11.5–15.5)
WBC: 12.4 10*3/uL — ABNORMAL HIGH (ref 4.0–10.5)
nRBC: 0 % (ref 0.0–0.2)

## 2018-10-12 LAB — GLUCOSE, CAPILLARY
GLUCOSE-CAPILLARY: 222 mg/dL — AB (ref 70–99)
Glucose-Capillary: 168 mg/dL — ABNORMAL HIGH (ref 70–99)
Glucose-Capillary: 208 mg/dL — ABNORMAL HIGH (ref 70–99)
Glucose-Capillary: 213 mg/dL — ABNORMAL HIGH (ref 70–99)
Glucose-Capillary: 232 mg/dL — ABNORMAL HIGH (ref 70–99)
Glucose-Capillary: 246 mg/dL — ABNORMAL HIGH (ref 70–99)

## 2018-10-12 LAB — BLOOD PRODUCT ORDER (VERBAL) VERIFICATION

## 2018-10-12 MED ORDER — CHLORHEXIDINE GLUCONATE 0.12% ORAL RINSE (MEDLINE KIT)
15.0000 mL | Freq: Two times a day (BID) | OROMUCOSAL | Status: DC
  Administered 2018-10-12 – 2018-11-09 (×58): 15 mL via OROMUCOSAL

## 2018-10-12 MED ORDER — ORAL CARE MOUTH RINSE
15.0000 mL | OROMUCOSAL | Status: DC
  Administered 2018-10-12 – 2018-10-21 (×91): 15 mL via OROMUCOSAL

## 2018-10-12 MED ORDER — INSULIN ASPART 100 UNIT/ML ~~LOC~~ SOLN
0.0000 [IU] | SUBCUTANEOUS | Status: DC
  Administered 2018-10-12 – 2018-10-13 (×3): 7 [IU] via SUBCUTANEOUS
  Administered 2018-10-13: 4 [IU] via SUBCUTANEOUS
  Administered 2018-10-13 (×2): 7 [IU] via SUBCUTANEOUS
  Administered 2018-10-13 – 2018-10-14 (×3): 4 [IU] via SUBCUTANEOUS
  Administered 2018-10-14: 7 [IU] via SUBCUTANEOUS
  Administered 2018-10-14 – 2018-10-15 (×9): 4 [IU] via SUBCUTANEOUS
  Administered 2018-10-16: 3 [IU] via SUBCUTANEOUS
  Administered 2018-10-16: 4 [IU] via SUBCUTANEOUS
  Administered 2018-10-16 (×2): 3 [IU] via SUBCUTANEOUS
  Administered 2018-10-16: 4 [IU] via SUBCUTANEOUS
  Administered 2018-10-16 – 2018-10-17 (×6): 3 [IU] via SUBCUTANEOUS
  Administered 2018-10-18 (×2): 4 [IU] via SUBCUTANEOUS
  Administered 2018-10-18: 3 [IU] via SUBCUTANEOUS
  Administered 2018-10-19: 7 [IU] via SUBCUTANEOUS
  Administered 2018-10-19: 4 [IU] via SUBCUTANEOUS
  Administered 2018-10-19 (×2): 7 [IU] via SUBCUTANEOUS
  Administered 2018-10-19: 4 [IU] via SUBCUTANEOUS
  Administered 2018-10-19: 7 [IU] via SUBCUTANEOUS
  Administered 2018-10-19: 4 [IU] via SUBCUTANEOUS
  Administered 2018-10-20 (×3): 7 [IU] via SUBCUTANEOUS
  Administered 2018-10-20: 4 [IU] via SUBCUTANEOUS
  Administered 2018-10-20 – 2018-10-21 (×2): 7 [IU] via SUBCUTANEOUS
  Administered 2018-10-21: 4 [IU] via SUBCUTANEOUS
  Administered 2018-10-21 (×2): 7 [IU] via SUBCUTANEOUS
  Administered 2018-10-21: 4 [IU] via SUBCUTANEOUS
  Administered 2018-10-21: 11 [IU] via SUBCUTANEOUS
  Administered 2018-10-22 (×2): 4 [IU] via SUBCUTANEOUS
  Administered 2018-10-22: 3 [IU] via SUBCUTANEOUS
  Administered 2018-10-22 (×2): 4 [IU] via SUBCUTANEOUS
  Administered 2018-10-22: 3 [IU] via SUBCUTANEOUS
  Administered 2018-10-23: 7 [IU] via SUBCUTANEOUS
  Administered 2018-10-23: 11 [IU] via SUBCUTANEOUS
  Administered 2018-10-23: 4 [IU] via SUBCUTANEOUS
  Administered 2018-10-23 (×2): 11 [IU] via SUBCUTANEOUS
  Administered 2018-10-23: 4 [IU] via SUBCUTANEOUS
  Administered 2018-10-23: 11 [IU] via SUBCUTANEOUS
  Administered 2018-10-24: 15 [IU] via SUBCUTANEOUS
  Administered 2018-10-24 (×4): 11 [IU] via SUBCUTANEOUS
  Administered 2018-10-24: 15 [IU] via SUBCUTANEOUS
  Administered 2018-10-25: 20 [IU] via SUBCUTANEOUS
  Administered 2018-10-25 (×3): 15 [IU] via SUBCUTANEOUS
  Administered 2018-10-25: 11 [IU] via SUBCUTANEOUS
  Administered 2018-10-26: 15 [IU] via SUBCUTANEOUS
  Administered 2018-10-26: 20 [IU] via SUBCUTANEOUS
  Administered 2018-10-26 (×3): 15 [IU] via SUBCUTANEOUS
  Administered 2018-10-26: 20 [IU] via SUBCUTANEOUS
  Administered 2018-10-27: 15 [IU] via SUBCUTANEOUS
  Administered 2018-10-27: 17 [IU] via SUBCUTANEOUS
  Administered 2018-10-27 (×2): 15 [IU] via SUBCUTANEOUS
  Administered 2018-10-27: 11 [IU] via SUBCUTANEOUS
  Administered 2018-10-27 – 2018-10-28 (×2): 15 [IU] via SUBCUTANEOUS
  Administered 2018-10-28 (×2): 11 [IU] via SUBCUTANEOUS

## 2018-10-12 MED ORDER — PANTOPRAZOLE SODIUM 40 MG IV SOLR
40.0000 mg | Freq: Every day | INTRAVENOUS | Status: DC
  Administered 2018-10-12 – 2018-11-15 (×35): 40 mg via INTRAVENOUS
  Filled 2018-10-12 (×34): qty 40
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
## 2018-10-12 NOTE — Progress Notes (Signed)
Patient transported on ventilator to CT and back to 6X50 with no complications. Vitals stable.
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
## 2018-10-13 LAB — GLUCOSE, CAPILLARY
Glucose-Capillary: 201 mg/dL — ABNORMAL HIGH (ref 70–99)
Glucose-Capillary: 192 mg/dL — ABNORMAL HIGH (ref 70–99)
Glucose-Capillary: 250 mg/dL — ABNORMAL HIGH (ref 70–99)
Glucose-Capillary: 193 mg/dL — ABNORMAL HIGH (ref 70–99)

## 2018-10-13 LAB — BASIC METABOLIC PANEL
Anion gap: 12 (ref 5–15)
BUN: 12 mg/dL (ref 6–20)
CO2: 21 mmol/L — ABNORMAL LOW (ref 22–32)
Calcium: 8.6 mg/dL — ABNORMAL LOW (ref 8.9–10.3)
Chloride: 102 mmol/L (ref 98–111)
Creatinine, Ser: 0.98 mg/dL (ref 0.61–1.24)
GFR calc Af Amer: >60 mL/min (ref >60)
Glucose, Bld: 238 mg/dL — ABNORMAL HIGH (ref 70–99)
Potassium: 4.6 mmol/L (ref 3.5–5.1)
Sodium: 135 mmol/L (ref 135–145)

## 2018-10-13 LAB — CBC
HCT: 34.1 % — ABNORMAL LOW (ref 39.0–52.0)
Hemoglobin: 11.5 g/dL — ABNORMAL LOW (ref 13.0–17.0)
MCH: 30.5 pg (ref 26.0–34.0)
MCHC: 33.7 g/dL (ref 30.0–36.0)
MCV: 90.5 fL (ref 80.0–100.0)
Platelets: 191 10*3/uL (ref 150–400)
RBC: 3.77 MIL/uL — ABNORMAL LOW (ref 4.22–5.81)
RDW: 13 % (ref 11.5–15.5)
WBC: 12.1 10*3/uL — ABNORMAL HIGH (ref 4.0–10.5)
nRBC: 0 % (ref 0.0–0.2)

## 2018-10-13 LAB — MAGNESIUM: Magnesium: 1.7 mg/dL (ref 1.7–2.4)

## 2018-10-13 LAB — TRIGLYCERIDES: Triglycerides: 388 mg/dL — ABNORMAL HIGH (ref <150)

## 2018-10-13 LAB — URINE CULTURE: CULTURE: NO GROWTH

## 2018-10-13 NOTE — Progress Notes (Signed)
Patient ID: Randy Carson, male   DOB: [birth year], 57 y.o.   MRN: [id number] Weaning well but with propofol held still does not wake up or F/C. Will continue weaning and try again tomorrow.  [name], MD, MPH, FACS Trauma: [phone] General Surgery: [phone]

## 2018-10-13 NOTE — Progress Notes (Signed)
Patient ID: Randy Carson, male   DOB: 28-Jul-1961, 57 y.o.   MRN: [id number] Patient remains on ventilator Will open eyes to pain and grimace appropriately Plus or minus following commands with hand movement Continue supportive care for interhemispheric subdural hematoma and multitrauma
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
## 2018-10-14 ENCOUNTER — Encounter (HOSPITAL_COMMUNITY): Payer: Self-pay | Admitting: *Deleted

## 2018-10-14 ENCOUNTER — Inpatient Hospital Stay (HOSPITAL_COMMUNITY): Payer: No Typology Code available for payment source

## 2018-10-14 LAB — CBC
HCT: 32 % — ABNORMAL LOW (ref 39.0–52.0)
Hemoglobin: 10.4 g/dL — ABNORMAL LOW (ref 13.0–17.0)
MCH: 29.7 pg (ref 26.0–34.0)
MCHC: 32.5 g/dL (ref 30.0–36.0)
MCV: 91.4 fL (ref 80.0–100.0)
Platelets: 187 10*3/uL (ref 150–400)
RBC: 3.5 MIL/uL — ABNORMAL LOW (ref 4.22–5.81)
RDW: 13.1 % (ref 11.5–15.5)
WBC: 12.9 10*3/uL — ABNORMAL HIGH (ref 4.0–10.5)
nRBC: 0 % (ref 0.0–0.2)

## 2018-10-14 LAB — BASIC METABOLIC PANEL
Anion gap: 13 (ref 5–15)
BUN: 13 mg/dL (ref 6–20)
CO2: 19 mmol/L — ABNORMAL LOW (ref 22–32)
Calcium: 8.5 mg/dL — ABNORMAL LOW (ref 8.9–10.3)
Chloride: 105 mmol/L (ref 98–111)
Creatinine, Ser: 0.98 mg/dL (ref 0.61–1.24)
GFR calc Af Amer: >60 mL/min (ref >60)
GFR calc non Af Amer: >60 mL/min (ref >60)
GLUCOSE: 224 mg/dL — AB (ref 70–99)
Potassium: 4.3 mmol/L (ref 3.5–5.1)
Sodium: 137 mmol/L (ref 135–145)

## 2018-10-14 LAB — GLUCOSE, CAPILLARY
GLUCOSE-CAPILLARY: 159 mg/dL — AB (ref 70–99)
Glucose-Capillary: 187 mg/dL — ABNORMAL HIGH (ref 70–99)
Glucose-Capillary: 188 mg/dL — ABNORMAL HIGH (ref 70–99)
Glucose-Capillary: 194 mg/dL — ABNORMAL HIGH (ref 70–99)
Glucose-Capillary: 198 mg/dL — ABNORMAL HIGH (ref 70–99)
Glucose-Capillary: 199 mg/dL — ABNORMAL HIGH (ref 70–99)
Glucose-Capillary: 204 mg/dL — ABNORMAL HIGH (ref 70–99)
Glucose-Capillary: 207 mg/dL — ABNORMAL HIGH (ref 70–99)
Glucose-Capillary: 210 mg/dL — ABNORMAL HIGH (ref 70–99)
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
## 2018-10-14 NOTE — Progress Notes (Signed)
Inpatient Diabetes Program Recommendations  AACE/ADA: New Consensus Statement on Inpatient Glycemic Control (2015)  Target Ranges:  Prepandial:   less than 140 mg/dL      Peak postprandial:   less than 180 mg/dL (1-2 hours)      Critically ill patients:  140 - 180 mg/dL   Lab Results  Component Value Date   GLUCAP 187 (H) 10/14/2018    Review of Glycemic Control Results for AZIR, MUZYKA (MRN [id number]) as of 10/14/2018 14:03  Ref. Range 10/13/2018 19:36 10/13/2018 23:58 10/14/2018 03:56 10/14/2018 07:54 10/14/2018 11:35  Glucose-Capillary Latest Ref Range: 70 - 99 mg/dL 210 (H) 198 (H) 194 (H) 199 (H) 187 (H)   Diabetes history: DM 2 Outpatient Diabetes medications:  Basaglar 50 units daily, Metformin 1000 mg bid Current orders for Inpatient glycemic control: Novolog resistant q 4 hours Inpatient Diabetes Program Recommendations:   Note that patient was on basal insulin prior to admit. Please consider adding Levemir 12 units bid.   Thanks,  Adah Perl, RN, BC-ADM Inpatient Diabetes Coordinator Pager [phone] (8a-5p)

## 2018-10-14 NOTE — Progress Notes (Addendum)
Subjective: Patient reports (vent)  Objective: Vital signs in last 24 hours: Temp:  [99 F (37.2 C)-100.9 F (38.3 C)] 100.4 F (38 C) (12/26 1300) Pulse Rate:  [79-101] 94 (12/26 1300) Resp:  [14-20] 17 (12/26 1300) BP: (110-160)/(45-77) 150/72 (12/26 1300) SpO2:  [95 %-100 %] 100 % (12/26 1300) FiO2 (%):  [30 %] 30 % (12/26 1110)  Intake/Output from previous day: 12/25 0701 - 12/26 0700 In: 2297.5 [I.V.:2297.5] Out: 4098 [Urine:1665] Intake/Output this shift: Total I/O In: 697.4 [I.V.:697.4] Out: 325 [Urine:325]  Seadated, vent support. PEARL.   Lab Results: Recent Labs    10/13/18 0636 10/14/18 0324  WBC 12.1* 12.9*  HGB 11.5* 10.4*  HCT 34.1* 32.0*  PLT 191 187   BMET Recent Labs    10/13/18 0636 10/14/18 0324  NA 135 137  K 4.6 4.3  CL 102 105  CO2 21* 19*  GLUCOSE 238* 224*  BUN 12 13  CREATININE 0.98 0.98  CALCIUM 8.6* 8.5*    Studies/Results: Dg Chest Port 1 View  Result Date: 10/14/2018 CLINICAL DATA:  Respiratory failure. EXAM: PORTABLE CHEST 1 VIEW COMPARISON:  10/12/2018 FINDINGS: There is a right chest wall port a catheter with tip in the projection of the SVC. The ET tube tip is above the carina. There is an enteric tube with tip below the GE junction. Heart size normal. Low lung volumes and left base atelectasis. No airspace opacities. IMPRESSION: 1. Stable support apparatus. 2. Low lung volumes with atelectasis in the left base. Electronically Signed   By: Kerby Moors M.D.   On: 10/14/2018 08:05    Assessment/Plan:   LOS: 3 days  Suportive care continues   Verdis Prime 10/14/2018, 2:23 PM   Continue vent support

## 2018-10-15 LAB — GLUCOSE, CAPILLARY
GLUCOSE-CAPILLARY: 160 mg/dL — AB (ref 70–99)
Glucose-Capillary: 184 mg/dL — ABNORMAL HIGH (ref 70–99)
Glucose-Capillary: 180 mg/dL — ABNORMAL HIGH (ref 70–99)
Glucose-Capillary: 189 mg/dL — ABNORMAL HIGH (ref 70–99)
Glucose-Capillary: 177 mg/dL — ABNORMAL HIGH (ref 70–99)
Glucose-Capillary: 160 mg/dL — ABNORMAL HIGH (ref 70–99)

## 2018-10-15 LAB — TRIGLYCERIDES: Triglycerides: 187 mg/dL — ABNORMAL HIGH (ref <150)

## 2018-10-15 MED ORDER — FENTANYL CITRATE (PF) 100 MCG/2ML IJ SOLN
25.0000 ug | INTRAMUSCULAR | Status: DC | PRN
  Administered 2018-10-16 – 2018-10-20 (×7): 50 ug via INTRAVENOUS
  Administered 2018-10-20: 25 ug via INTRAVENOUS
  Filled 2018-10-15 (×8): qty 2

## 2018-10-15 MED ORDER — INSULIN DETEMIR 100 UNIT/ML ~~LOC~~ SOLN
10.0000 [IU] | Freq: Two times a day (BID) | SUBCUTANEOUS | Status: DC
  Administered 2018-10-15 – 2018-10-24 (×18): 10 [IU] via SUBCUTANEOUS
  Filled 2018-10-15 (×19): qty 0.1
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
## 2018-10-15 NOTE — Progress Notes (Signed)
Notified Dr. Georgette Dover about patient's fever of 102.8. Ordered cooling blanket. Also changed the Fentanyl order to a lower dose, to minimize sedative effects.

## 2018-10-15 NOTE — Progress Notes (Signed)
Inpatient Diabetes Program Recommendations  AACE/ADA: New Consensus Statement on Inpatient Glycemic Control (2015)  Target Ranges:  Prepandial:   less than 140 mg/dL      Peak postprandial:   less than 180 mg/dL (1-2 hours)      Critically ill patients:  140 - 180 mg/dL   Lab Results  Component Value Date   GLUCAP 189 (H) 10/15/2018    Review of Glycemic Control Results for CHRISTIN, MCCREEDY (MRN [id number]) as of 10/15/2018 12:17  Ref. Range 10/14/2018 19:43 10/14/2018 23:28 10/15/2018 03:30 10/15/2018 07:15 10/15/2018 11:28  Glucose-Capillary Latest Ref Range: 70 - 99 mg/dL 159 (H) 188 (H) 184 (H) 180 (H) 189 (H)   Diabetes history: DM 2 Outpatient Diabetes medications:  Basaglar 50 units daily, Metformin 1000 mg bid Current orders for Inpatient glycemic control: Novolog resistant q 4 hours Inpatient Diabetes Program Recommendations:   Note that patient was on basal insulin prior to admit. Please consider adding Levemir 10 units bid.  Patient has received 27 units Novolog correction over the past 24 hrs.  Thank you, Nani Gasser. Artis Beggs, RN, MSN, CDE  Diabetes Coordinator Inpatient Glycemic Control Team Team Pager [phone] (8am-5pm) 10/15/2018 12:18 PM

## 2018-10-15 NOTE — Progress Notes (Signed)
Subjective: Patient continues on ventilator via ETT.  Has had difficulties with weaning from ventilator, with agitation when sedation is tapered.  Has required fentanyl IV as needed.  However his nurse notes that when he is not sedated he is following commands.  Objective: Vital signs in last 24 hours: Vitals:   10/15/18 1600 10/15/18 1700 10/15/18 1744 10/15/18 1800  BP: (!) 120/32 (!) 127/35 (!) 127/35 (!) 102/41  Pulse: 85 72 99 (!) 103  Resp: 18 (!) 24 20 13   Temp:  (!) 102.6 F (39.2 C) (!) 101.1 F (38.4 C)   TempSrc:  Axillary Axillary   SpO2: 96% 98% 92% 91%  Weight:      Height:        Intake/Output from previous day: 12/26 0701 - 12/27 0700 In: 2566 [I.V.:2496; NG/GT:70] Out: 1480 [Urine:1480] Intake/Output this shift: Total I/O In: 1097.7 [I.V.:1097.7] Out: 550 [Urine:550]  Physical Exam: Pupils 3 mm -> 2 mm, round and reactive to light.  CBC Recent Labs    10/13/18 0636 10/14/18 0324  WBC 12.1* 12.9*  HGB 11.5* 10.4*  HCT 34.1* 32.0*  PLT 191 187   BMET Recent Labs    10/13/18 0636 10/14/18 0324  NA 135 137  K 4.6 4.3  CL 102 105  CO2 21* 19*  GLUCOSE 238* 224*  BUN 12 13  CREATININE 0.98 0.98  CALCIUM 8.6* 8.5*    Studies/Results: Dg Chest Port 1 View  Result Date: 10/14/2018 CLINICAL DATA:  Respiratory failure. EXAM: PORTABLE CHEST 1 VIEW COMPARISON:  10/12/2018 FINDINGS: There is a right chest wall port a catheter with tip in the projection of the SVC. The ET tube tip is above the carina. There is an enteric tube with tip below the GE junction. Heart size normal. Low lung volumes and left base atelectasis. No airspace opacities. IMPRESSION: 1. Stable support apparatus. 2. Low lung volumes with atelectasis in the left base. Electronically Signed   By: Kerby Moors M.D.   On: 10/14/2018 08:05    Assessment/Plan: Overall stable from a neurosurgical perspective.  Continue supportive care.   Hosie Spangle, MD 10/15/2018, 6:58 PM

## 2018-10-16 ENCOUNTER — Inpatient Hospital Stay (HOSPITAL_COMMUNITY): Payer: No Typology Code available for payment source

## 2018-10-16 LAB — CBC
HCT: 34.4 % — ABNORMAL LOW (ref 39.0–52.0)
HEMOGLOBIN: 11 g/dL — AB (ref 13.0–17.0)
MCH: 29.8 pg (ref 26.0–34.0)
MCHC: 32 g/dL (ref 30.0–36.0)
MCV: 93.2 fL (ref 80.0–100.0)
Platelets: 243 10*3/uL (ref 150–400)
RBC: 3.69 MIL/uL — ABNORMAL LOW (ref 4.22–5.81)
RDW: 13.2 % (ref 11.5–15.5)
WBC: 13.5 10*3/uL — ABNORMAL HIGH (ref 4.0–10.5)
nRBC: 0 % (ref 0.0–0.2)

## 2018-10-16 LAB — GLUCOSE, CAPILLARY
Glucose-Capillary: 133 mg/dL — ABNORMAL HIGH (ref 70–99)
Glucose-Capillary: 156 mg/dL — ABNORMAL HIGH (ref 70–99)
Glucose-Capillary: 144 mg/dL — ABNORMAL HIGH (ref 70–99)
Glucose-Capillary: 131 mg/dL — ABNORMAL HIGH (ref 70–99)
Glucose-Capillary: 124 mg/dL — ABNORMAL HIGH (ref 70–99)
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
## 2018-10-16 NOTE — Progress Notes (Signed)
PT Cancellation Note  Patient Details Name: Randy Carson MRN: [id number] DOB: 18-Aug-1961   Cancelled Treatment:    Reason Eval/Treat Not Completed: Patient not medically ready;Active bedrest order   Duncan Dull 10/16/2018, 2:13 PM
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
## 2018-10-17 ENCOUNTER — Inpatient Hospital Stay (HOSPITAL_COMMUNITY): Payer: No Typology Code available for payment source

## 2018-10-17 ENCOUNTER — Inpatient Hospital Stay (HOSPITAL_COMMUNITY): Payer: No Typology Code available for payment source | Admitting: Certified Registered"

## 2018-10-17 LAB — CBC
HCT: 29.4 % — ABNORMAL LOW (ref 39.0–52.0)
HEMOGLOBIN: 9.6 g/dL — AB (ref 13.0–17.0)
MCH: 30 pg (ref 26.0–34.0)
MCHC: 32.7 g/dL (ref 30.0–36.0)
MCV: 91.9 fL (ref 80.0–100.0)
Platelets: 244 10*3/uL (ref 150–400)
RBC: 3.2 MIL/uL — ABNORMAL LOW (ref 4.22–5.81)
RDW: 13.2 % (ref 11.5–15.5)
WBC: 10.9 10*3/uL — ABNORMAL HIGH (ref 4.0–10.5)
nRBC: 0 % (ref 0.0–0.2)

## 2018-10-17 LAB — BASIC METABOLIC PANEL
Anion gap: 11 (ref 5–15)
BUN: 17 mg/dL (ref 6–20)
CO2: 21 mmol/L — AB (ref 22–32)
Calcium: 8.3 mg/dL — ABNORMAL LOW (ref 8.9–10.3)
Chloride: 108 mmol/L (ref 98–111)
Creatinine, Ser: 0.84 mg/dL (ref 0.61–1.24)
GFR calc Af Amer: >60 mL/min (ref >60)
GFR calc non Af Amer: >60 mL/min (ref >60)
Glucose, Bld: 154 mg/dL — ABNORMAL HIGH (ref 70–99)
POTASSIUM: 4.2 mmol/L (ref 3.5–5.1)
Sodium: 140 mmol/L (ref 135–145)

## 2018-10-17 LAB — CULTURE, BLOOD (ROUTINE X 2)
CULTURE: NO GROWTH
Culture: NO GROWTH
Special Requests: ADEQUATE

## 2018-10-17 LAB — GLUCOSE, CAPILLARY
Glucose-Capillary: 129 mg/dL — ABNORMAL HIGH (ref 70–99)
Glucose-Capillary: 134 mg/dL — ABNORMAL HIGH (ref 70–99)
Glucose-Capillary: 136 mg/dL — ABNORMAL HIGH (ref 70–99)
Glucose-Capillary: 106 mg/dL — ABNORMAL HIGH (ref 70–99)
Glucose-Capillary: 100 mg/dL — ABNORMAL HIGH (ref 70–99)
Glucose-Capillary: 139 mg/dL — ABNORMAL HIGH (ref 70–99)

## 2018-10-17 MED ORDER — SUCCINYLCHOLINE CHLORIDE 20 MG/ML IJ SOLN
INTRAMUSCULAR | Status: DC | PRN
  Administered 2018-10-17: 140 mg via INTRAVENOUS

## 2018-10-17 MED ORDER — SODIUM CHLORIDE 0.9% FLUSH
10.0000 mL | INTRAVENOUS | Status: DC | PRN
  Administered 2018-11-01: 10 mL
  Filled 2018-10-17: qty 40

## 2018-10-17 MED ORDER — PROPOFOL 1000 MG/100ML IV EMUL
5.0000 ug/kg/min | INTRAVENOUS | Status: DC
  Administered 2018-10-17: 5 ug/kg/min via INTRAVENOUS
  Filled 2018-10-17: qty 100

## 2018-10-17 MED ORDER — PIPERACILLIN-TAZOBACTAM 3.375 G IVPB 30 MIN
3.3750 g | Freq: Three times a day (TID) | INTRAVENOUS | Status: DC
  Administered 2018-10-17 – 2018-10-21 (×11): 3.375 g via INTRAVENOUS
  Filled 2018-10-17 (×20): qty 50

## 2018-10-17 MED ORDER — SODIUM CHLORIDE 0.9 % IV BOLUS
500.0000 mL | Freq: Once | INTRAVENOUS | Status: AC
  Administered 2018-10-17: 500 mL via INTRAVENOUS

## 2018-10-17 MED ORDER — PROPOFOL 10 MG/ML IV BOLUS
INTRAVENOUS | Status: DC | PRN
  Administered 2018-10-17: 170 mg via INTRAVENOUS

## 2018-10-17 MED ORDER — IBUPROFEN 100 MG/5ML PO SUSP
800.0000 mg | Freq: Once | ORAL | Status: AC
  Administered 2018-10-17: 800 mg
  Filled 2018-10-17: qty 40

## 2018-10-17 MED ORDER — SODIUM CHLORIDE 0.9 % IV SOLN
INTRAVENOUS | Status: DC | PRN
  Administered 2018-10-17: 250 mL via INTRAVENOUS

## 2018-10-17 MED ORDER — ACETAMINOPHEN 650 MG RE SUPP
650.0000 mg | RECTAL | Status: DC | PRN
  Administered 2018-10-17 (×2): 650 mg via RECTAL
  Filled 2018-10-17 (×2): qty 1

## 2018-10-17 MED ORDER — CHLORHEXIDINE GLUCONATE CLOTH 2 % EX PADS
6.0000 | MEDICATED_PAD | Freq: Every day | CUTANEOUS | Status: DC
  Administered 2018-10-17 – 2018-10-25 (×8): 6 via TOPICAL
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
## 2018-10-17 NOTE — Procedures (Signed)
Extubation Procedure Note  Patient Details:   Name: Randy Carson DOB: 02-15-1961 MRN: [id number]   Airway Documentation:    Vent end date: 10/17/18 Vent end time: 0942   Evaluation  O2 sats: stable throughout Complications: No apparent complications Patient did tolerate procedure well. Bilateral Breath Sounds: Diminished   The patient was able to breathe around the ETT prior to extubation but was reluctant to speak after extubation. The patient has a strong cough without stridor post extubation.   Acquanetta Belling 10/17/2018, 9:42 AM

## 2018-10-17 NOTE — Progress Notes (Signed)
PT Cancellation Note  Patient Details Name: Randy Carson MRN: [id number] DOB: 1961-04-06   Cancelled Treatment:    Reason Eval/Treat Not Completed: Patient not medically ready;Active bedrest order   Duncan Dull 10/17/2018, 6:32 AM
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
## 2018-10-17 NOTE — Anesthesia Procedure Notes (Signed)
Procedure Name: Intubation Date/Time: 10/17/2018 10:06 AM Performed by: Myna Bright, CRNA Pre-anesthesia Checklist: Patient identified, Emergency Drugs available, Suction available, Patient being monitored and Timeout performed Patient Re-evaluated:Patient Re-evaluated prior to induction Oxygen Delivery Method: Ambu bag Preoxygenation: Pre-oxygenation with 100% oxygen Induction Type: IV induction Laryngoscope Size: Glidescope and 4 Tube type: Oral Tube size: 7.5 mm Number of attempts: 1 Airway Equipment and Method: Stylet and Video-laryngoscopy Placement Confirmation: ETT inserted through vocal cords under direct vision,  positive ETCO2 and breath sounds checked- equal and bilateral Secured at: 23 cm Tube secured with: Tape Dental Injury: Teeth and Oropharynx as per pre-operative assessment  Difficulty Due To: Difficult Airway- due to cervical collar Comments: Smooth IV induction. C-collar remained intact throughout position and neutral C-spine maintained. DL with Glidescope. Grade I view with Glide. Edema noted. Atraumatic oral intubation. +EtCO2, +BBS.

## 2018-10-17 NOTE — Progress Notes (Signed)
Notified Dr. Donne Hazel of no urine output via condom cath. Bladder scan showed 140ml. Orders for BMET, 500 ml NS bolus, and foley

## 2018-10-17 NOTE — Progress Notes (Signed)
Pt working hard to breath/ gasping. Sats upper 80s. Mask placed. Sats up, RR 27, HR 118. Dr. Brantley Stage paged- anesthesia to re intubate.

## 2018-10-18 DIAGNOSIS — I6200 Nontraumatic subdural hemorrhage, unspecified: Secondary | ICD-10-CM

## 2018-10-18 LAB — CBC
HCT: 25.4 % — ABNORMAL LOW (ref 39.0–52.0)
HEMOGLOBIN: 8.1 g/dL — AB (ref 13.0–17.0)
MCH: 30.1 pg (ref 26.0–34.0)
MCHC: 31.9 g/dL (ref 30.0–36.0)
MCV: 94.4 fL (ref 80.0–100.0)
Platelets: 238 10*3/uL (ref 150–400)
RBC: 2.69 MIL/uL — AB (ref 4.22–5.81)
RDW: 13.6 % (ref 11.5–15.5)
WBC: 7.6 10*3/uL (ref 4.0–10.5)
nRBC: 0 % (ref 0.0–0.2)

## 2018-10-18 LAB — GLUCOSE, CAPILLARY
GLUCOSE-CAPILLARY: 174 mg/dL — AB (ref 70–99)
GLUCOSE-CAPILLARY: 167 mg/dL — AB (ref 70–99)
Glucose-Capillary: 109 mg/dL — ABNORMAL HIGH (ref 70–99)
Glucose-Capillary: 120 mg/dL — ABNORMAL HIGH (ref 70–99)
Glucose-Capillary: 130 mg/dL — ABNORMAL HIGH (ref 70–99)
Glucose-Capillary: 145 mg/dL — ABNORMAL HIGH (ref 70–99)
Glucose-Capillary: 206 mg/dL — ABNORMAL HIGH (ref 70–99)

## 2018-10-18 LAB — PHOSPHORUS: Phosphorus: 3.4 mg/dL (ref 2.5–4.6)

## 2018-10-18 LAB — MAGNESIUM: Magnesium: 2 mg/dL (ref 1.7–2.4)

## 2018-10-18 LAB — TRIGLYCERIDES: Triglycerides: 201 mg/dL — ABNORMAL HIGH (ref <150)

## 2018-10-18 MED ORDER — PRO-STAT SUGAR FREE PO LIQD
60.0000 mL | Freq: Three times a day (TID) | ORAL | Status: DC
  Administered 2018-10-18 – 2018-10-21 (×9): 60 mL
  Filled 2018-10-18 (×9): qty 60

## 2018-10-18 MED ORDER — ADULT MULTIVITAMIN W/MINERALS CH
1.0000 | ORAL_TABLET | Freq: Every day | ORAL | Status: DC
  Administered 2018-10-18 – 2018-11-26 (×37): 1
  Filled 2018-10-18 (×38): qty 1

## 2018-10-18 MED ORDER — PIVOT 1.5 CAL PO LIQD
1000.0000 mL | ORAL | Status: DC
  Administered 2018-10-18 – 2018-10-20 (×3): 1000 mL

## 2018-10-18 MED ORDER — ACETAMINOPHEN 650 MG RE SUPP
650.0000 mg | RECTAL | Status: DC | PRN
  Administered 2018-10-27: 650 mg via RECTAL
  Filled 2018-10-18: qty 1

## 2018-10-18 MED ORDER — FUROSEMIDE 10 MG/ML IJ SOLN
40.0000 mg | Freq: Two times a day (BID) | INTRAMUSCULAR | Status: AC
  Administered 2018-10-18 (×2): 40 mg via INTRAVENOUS
  Filled 2018-10-18 (×2): qty 4

## 2018-10-18 MED ORDER — ACETAMINOPHEN 160 MG/5ML PO SOLN
650.0000 mg | ORAL | Status: DC | PRN
  Administered 2018-10-18 – 2018-11-12 (×30): 650 mg
  Filled 2018-10-18 (×30): qty 20.3
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
## 2018-10-18 NOTE — Progress Notes (Addendum)
   10/17/18 2000  Vitals  Temp (!) 103.1 F (39.5 C)  Temp Source Rectal  BP (!) 120/59  MAP (mmHg) 76  BP Location Right Arm  BP Method Automatic  Patient Position (if appropriate) Lying  Pulse Rate 90  Pulse Rate Source Monitor  ECG Heart Rate 90  Cardiac Rhythm NSR  New onset of dysrhythmia? No  Resp 20  Oxygen Therapy  SpO2 100 %  O2 Device Ventilator  FiO2 (%) 30 %  Patient Activity (if Appropriate) In bed  Pulse Oximetry Type Continuous  SpO2 Alarm Limit Low 92  Oximetry Probe Site Changed No  Pre-WUA / WUA Start  Pre WUA sedation dose Continuous  [place] Agitation Sedation Scale (RASS) -1  RASS Goal -1  Pain Assessment  Pain Scale CPOT  Critical Care Pain Observation Tool (CPOT)  Facial Expression 0  Body Movements 0  Muscle Tension 0  Compliance with ventilator (intubated pts.) 0  Vocalization (extubated pts.) N/A  CPOT Total 0  Complaints & Interventions  Neuro symptoms relieved by Rest  PCA/Epidural/Spinal Assessment  Respiratory Pattern Regular;Unlabored  Glasgow Coma Scale  Eye Opening 3  Modified Verbal Response (INTUBATED) 3  Best Motor Response 4  Glasgow Coma Scale Score 10  Provider Notification  Provider Name/Title Dr. Brantley Stage, Trauma MD  Date Provider Notified 10/17/18  Time Provider Notified 2000  Notification Type Page  Notification Reason Change in status (Temp 103.1)  Response See new orders  Date of Provider Response 10/17/18  Time of Provider Response 2000   Repeat blood and urine cultures, collect respiratory culture. Zosyn IV started, as well as one time dose 800 ibuprofen  Per tube.

## 2018-10-18 NOTE — Progress Notes (Signed)
Rehab Admissions Coordinator Note:  Patient was screened by Cleatrice Burke for appropriateness for an Inpatient Acute Rehab Consult per OT recs. Noted patient currently is Rancho Coma recovery III. I will follow his progress with therapies before requesting an inpt rehab consult.  Danne Baxter, RN, MSN Rehab Admissions Coordinator [phone] 10/18/2018 3:31 PM

## 2018-10-18 NOTE — Progress Notes (Signed)
Notified [place] (Spoke with Leandrew Koyanagi, [place]) of pt admission, and gave update on pt condition.    Last 4 of SS # [id number] Patient is non-service connected.    Reinaldo Raddle, RN, BSN  Trauma/Neuro ICU Case Manager [phone]
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
## 2018-10-19 ENCOUNTER — Inpatient Hospital Stay (HOSPITAL_COMMUNITY): Payer: No Typology Code available for payment source

## 2018-10-19 ENCOUNTER — Encounter (HOSPITAL_COMMUNITY): Payer: Self-pay | Admitting: *Deleted

## 2018-10-19 LAB — CBC
HCT: 27.8 % — ABNORMAL LOW (ref 39.0–52.0)
Hemoglobin: 8.9 g/dL — ABNORMAL LOW (ref 13.0–17.0)
MCH: 30 pg (ref 26.0–34.0)
MCHC: 32 g/dL (ref 30.0–36.0)
MCV: 93.6 fL (ref 80.0–100.0)
NRBC: 0 % (ref 0.0–0.2)
Platelets: 324 10*3/uL (ref 150–400)
RBC: 2.97 MIL/uL — ABNORMAL LOW (ref 4.22–5.81)
RDW: 13.6 % (ref 11.5–15.5)
WBC: 8.4 10*3/uL (ref 4.0–10.5)

## 2018-10-19 LAB — MAGNESIUM
Magnesium: 1.9 mg/dL (ref 1.7–2.4)
Magnesium: 2 mg/dL (ref 1.7–2.4)

## 2018-10-19 LAB — BASIC METABOLIC PANEL
Anion gap: 10 (ref 5–15)
BUN: 25 mg/dL — ABNORMAL HIGH (ref 6–20)
CO2: 25 mmol/L (ref 22–32)
Calcium: 8.4 mg/dL — ABNORMAL LOW (ref 8.9–10.3)
Chloride: 108 mmol/L (ref 98–111)
Creatinine, Ser: 0.93 mg/dL (ref 0.61–1.24)
GFR calc Af Amer: >60 mL/min (ref >60)
GFR calc non Af Amer: >60 mL/min (ref >60)
Glucose, Bld: 223 mg/dL — ABNORMAL HIGH (ref 70–99)
Potassium: 3.4 mmol/L — ABNORMAL LOW (ref 3.5–5.1)
Sodium: 143 mmol/L (ref 135–145)

## 2018-10-19 LAB — PHOSPHORUS
Phosphorus: 3.1 mg/dL (ref 2.5–4.6)
Phosphorus: 3 mg/dL (ref 2.5–4.6)

## 2018-10-19 LAB — GLUCOSE, CAPILLARY
GLUCOSE-CAPILLARY: 188 mg/dL — AB (ref 70–99)
Glucose-Capillary: 175 mg/dL — ABNORMAL HIGH (ref 70–99)
Glucose-Capillary: 191 mg/dL — ABNORMAL HIGH (ref 70–99)
Glucose-Capillary: 208 mg/dL — ABNORMAL HIGH (ref 70–99)
Glucose-Capillary: 220 mg/dL — ABNORMAL HIGH (ref 70–99)
Glucose-Capillary: 232 mg/dL — ABNORMAL HIGH (ref 70–99)

## 2018-10-19 LAB — URINE CULTURE: Culture: NO GROWTH

## 2018-10-19 MED ORDER — VANCOMYCIN HCL IN DEXTROSE 1-5 GM/200ML-% IV SOLN: 1000.0000 mg | Freq: Two times a day (BID) | INTRAVENOUS | Status: DC

## 2018-10-19 MED ORDER — VANCOMYCIN HCL IN DEXTROSE 1-5 GM/200ML-% IV SOLN
1000.0000 mg | Freq: Two times a day (BID) | INTRAVENOUS | Status: DC
  Administered 2018-10-20 – 2018-10-21 (×3): 1000 mg via INTRAVENOUS
  Filled 2018-10-19 (×4): qty 200

## 2018-10-19 MED ORDER — VANCOMYCIN HCL 10 G IV SOLR
2500.0000 mg | Freq: Once | INTRAVENOUS | Status: AC
  Administered 2018-10-19: 2500 mg via INTRAVENOUS
  Filled 2018-10-19: qty 2500

## 2018-10-19 MED ORDER — POTASSIUM CHLORIDE 10 MEQ/100ML IV SOLN
10.0000 meq | INTRAVENOUS | Status: AC
  Administered 2018-10-19 (×2): 10 meq via INTRAVENOUS
  Filled 2018-10-19: qty 100
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
## 2018-10-20 ENCOUNTER — Encounter (HOSPITAL_COMMUNITY): Payer: Self-pay | Admitting: *Deleted

## 2018-10-20 ENCOUNTER — Inpatient Hospital Stay (HOSPITAL_COMMUNITY): Payer: No Typology Code available for payment source

## 2018-10-20 LAB — COMPREHENSIVE METABOLIC PANEL
ALT: 30 U/L (ref 0–44)
AST: 27 U/L (ref 15–41)
Albumin: 2 g/dL — ABNORMAL LOW (ref 3.5–5.0)
Alkaline Phosphatase: 57 U/L (ref 38–126)
Anion gap: 9 (ref 5–15)
BUN: 27 mg/dL — ABNORMAL HIGH (ref 6–20)
CO2: 25 mmol/L (ref 22–32)
CREATININE: 0.79 mg/dL (ref 0.61–1.24)
Calcium: 8.4 mg/dL — ABNORMAL LOW (ref 8.9–10.3)
Chloride: 109 mmol/L (ref 98–111)
GFR calc non Af Amer: >60 mL/min (ref >60)
Glucose, Bld: 251 mg/dL — ABNORMAL HIGH (ref 70–99)
Potassium: 3.5 mmol/L (ref 3.5–5.1)
Sodium: 143 mmol/L (ref 135–145)
Total Bilirubin: 0.8 mg/dL (ref 0.3–1.2)
Total Protein: 6.3 g/dL — ABNORMAL LOW (ref 6.5–8.1)

## 2018-10-20 LAB — CBC
HCT: 30.1 % — ABNORMAL LOW (ref 39.0–52.0)
Hemoglobin: 9.3 g/dL — ABNORMAL LOW (ref 13.0–17.0)
MCH: 29.2 pg (ref 26.0–34.0)
MCHC: 30.9 g/dL (ref 30.0–36.0)
MCV: 94.7 fL (ref 80.0–100.0)
Platelets: 364 10*3/uL (ref 150–400)
RBC: 3.18 MIL/uL — ABNORMAL LOW (ref 4.22–5.81)
RDW: 13.8 % (ref 11.5–15.5)
WBC: 10.5 10*3/uL (ref 4.0–10.5)
nRBC: 0 % (ref 0.0–0.2)

## 2018-10-20 LAB — CULTURE, RESPIRATORY: CULTURE: NORMAL

## 2018-10-20 LAB — CULTURE, RESPIRATORY W GRAM STAIN

## 2018-10-20 LAB — MAGNESIUM: Magnesium: 1.9 mg/dL (ref 1.7–2.4)

## 2018-10-20 LAB — GLUCOSE, CAPILLARY: Glucose-Capillary: 229 mg/dL — ABNORMAL HIGH (ref 70–99)

## 2018-10-20 LAB — PHOSPHORUS: Phosphorus: 2.7 mg/dL (ref 2.5–4.6)

## 2018-10-20 MED ORDER — TAMSULOSIN HCL 0.4 MG PO CAPS
0.4000 mg | ORAL_CAPSULE | Freq: Every day | ORAL | Status: DC
  Administered 2018-10-20 – 2018-12-02 (×41): 0.4 mg via ORAL
  Filled 2018-10-20 (×41): qty 1
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
## 2018-10-20 NOTE — Progress Notes (Signed)
Overnight provider was not sure bladder scanner was accurate. Attempted to in and out cath to validate scanner results. Unable to pass catheter.  Anda Latina, RN also attempted and was unable. Will cont to monitor output in external catheter and scan bladder prn.

## 2018-10-20 NOTE — Progress Notes (Signed)
OT NOTE  Please order SLP for TBI team    Jeri Modena, OTR/L  Acute Rehabilitation Services Pager: [phone] Office: [phone] .
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
## 2018-10-20 NOTE — Progress Notes (Signed)
Patient not as awake after 2h of CPAP and bath. Will rest overnight and try again tomorrow for extubation

## 2018-10-21 LAB — GLUCOSE, CAPILLARY
Glucose-Capillary: 245 mg/dL — ABNORMAL HIGH (ref 70–99)
Glucose-Capillary: 181 mg/dL — ABNORMAL HIGH (ref 70–99)
Glucose-Capillary: 160 mg/dL — ABNORMAL HIGH (ref 70–99)
Glucose-Capillary: 157 mg/dL — ABNORMAL HIGH (ref 70–99)

## 2018-10-21 LAB — TRIGLYCERIDES: Triglycerides: 209 mg/dL — ABNORMAL HIGH (ref <150)

## 2018-10-21 MED ORDER — FENTANYL CITRATE (PF) 100 MCG/2ML IJ SOLN: 50.0000 ug | INTRAMUSCULAR | Status: DC | PRN

## 2018-10-21 MED ORDER — CHLORHEXIDINE GLUCONATE 0.12 % MT SOLN
OROMUCOSAL | Status: AC
  Administered 2018-10-21: 15 mL via OROMUCOSAL
  Filled 2018-10-21: qty 15

## 2018-10-21 NOTE — Procedures (Signed)
Extubation Procedure Note  Patient Details:   Name: Randy Carson DOB: 1961/06/04 MRN: [id number]   Airway Documentation:  Airway 7.5 mm (Active)  Secured at (cm) 25 cm 10/21/2018  8:21 AM  Measured From Lips 10/21/2018  8:21 AM  [place] 10/21/2018  8:21 AM  Secured By [employer] 10/21/2018  8:21 AM  Tube Holder Repositioned Yes 10/21/2018  8:21 AM  Cuff Pressure (cm H2O) 26 cm H2O 10/20/2018  7:58 AM  Site Condition Dry 10/21/2018  8:21 AM   Vent end date: 10/17/18 Vent end time: 0942   Evaluation  O2 sats: stable throughout Complications: No apparent complications Patient did tolerate procedure well. Bilateral Breath Sounds: Clear, Diminished   Yes   Pt extubated per MD order.  Sats 99% on 4L [place].  Pt tolerated well.  RT will continue to monitor.  Pierre Bali 10/21/2018, 10:45 AM
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
## 2018-10-22 ENCOUNTER — Inpatient Hospital Stay (HOSPITAL_COMMUNITY): Payer: No Typology Code available for payment source

## 2018-10-22 LAB — GLUCOSE, CAPILLARY
GLUCOSE-CAPILLARY: 205 mg/dL — AB (ref 70–99)
Glucose-Capillary: 161 mg/dL — ABNORMAL HIGH (ref 70–99)
Glucose-Capillary: 161 mg/dL — ABNORMAL HIGH (ref 70–99)
Glucose-Capillary: 166 mg/dL — ABNORMAL HIGH (ref 70–99)
Glucose-Capillary: 194 mg/dL — ABNORMAL HIGH (ref 70–99)
Glucose-Capillary: 216 mg/dL — ABNORMAL HIGH (ref 70–99)
Glucose-Capillary: 230 mg/dL — ABNORMAL HIGH (ref 70–99)
Glucose-Capillary: 231 mg/dL — ABNORMAL HIGH (ref 70–99)
Glucose-Capillary: 241 mg/dL — ABNORMAL HIGH (ref 70–99)
Glucose-Capillary: 266 mg/dL — ABNORMAL HIGH (ref 70–99)
Glucose-Capillary: 147 mg/dL — ABNORMAL HIGH (ref 70–99)
Glucose-Capillary: 149 mg/dL — ABNORMAL HIGH (ref 70–99)
Glucose-Capillary: 151 mg/dL — ABNORMAL HIGH (ref 70–99)

## 2018-10-22 LAB — CBC
HCT: 30.4 % — ABNORMAL LOW (ref 39.0–52.0)
HEMOGLOBIN: 9.2 g/dL — AB (ref 13.0–17.0)
MCH: 29.2 pg (ref 26.0–34.0)
MCHC: 30.3 g/dL (ref 30.0–36.0)
MCV: 96.5 fL (ref 80.0–100.0)
NRBC: 0 % (ref 0.0–0.2)
Platelets: 398 10*3/uL (ref 150–400)
RBC: 3.15 MIL/uL — ABNORMAL LOW (ref 4.22–5.81)
RDW: 14 % (ref 11.5–15.5)
WBC: 10.1 10*3/uL (ref 4.0–10.5)

## 2018-10-22 LAB — BASIC METABOLIC PANEL
ANION GAP: 9 (ref 5–15)
BUN: 20 mg/dL (ref 6–20)
CHLORIDE: 108 mmol/L (ref 98–111)
CO2: 27 mmol/L (ref 22–32)
Calcium: 8.7 mg/dL — ABNORMAL LOW (ref 8.9–10.3)
Creatinine, Ser: 0.82 mg/dL (ref 0.61–1.24)
GFR calc Af Amer: >60 mL/min (ref >60)
GFR calc non Af Amer: >60 mL/min (ref >60)
Glucose, Bld: 176 mg/dL — ABNORMAL HIGH (ref 70–99)
Potassium: 3.9 mmol/L (ref 3.5–5.1)
Sodium: 144 mmol/L (ref 135–145)

## 2018-10-22 LAB — CULTURE, BLOOD (ROUTINE X 2)
Culture: NO GROWTH
Culture: NO GROWTH
Special Requests: ADEQUATE
Special Requests: ADEQUATE

## 2018-10-22 MED ORDER — PIVOT 1.5 CAL PO LIQD: 1000.0000 mL | ORAL | Status: DC

## 2018-10-22 MED ORDER — IPRATROPIUM-ALBUTEROL 0.5-2.5 (3) MG/3ML IN SOLN
3.0000 mL | Freq: Four times a day (QID) | RESPIRATORY_TRACT | Status: DC
  Administered 2018-10-22 – 2018-10-23 (×7): 3 mL via RESPIRATORY_TRACT
  Filled 2018-10-22 (×7): qty 3

## 2018-10-22 MED ORDER — PIVOT 1.5 CAL PO LIQD
1000.0000 mL | ORAL | Status: DC
  Administered 2018-10-23 – 2018-10-25 (×5): 1000 mL
  Filled 2018-10-22 (×8): qty 1000

## 2018-10-22 MED ORDER — ENOXAPARIN SODIUM 40 MG/0.4ML ~~LOC~~ SOLN
40.0000 mg | Freq: Every day | SUBCUTANEOUS | Status: DC
  Administered 2018-10-22 – 2018-12-02 (×41): 40 mg via SUBCUTANEOUS
  Filled 2018-10-22 (×42): qty 0.4
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
## 2018-10-22 NOTE — Progress Notes (Signed)
Subjective: Patient reports nonverbal  Objective: Vital signs in last 24 hours: Temp:  [99 F (37.2 C)-100.4 F (38 C)] 100.2 F (37.9 C) (01/03 1200) Pulse Rate:  [69-94] 73 (01/03 1200) Resp:  [12-31] 31 (01/03 1200) BP: (120-158)/(52-107) 137/66 (01/03 1200) SpO2:  [95 %-100 %] 98 % (01/03 1200) Weight:  [121.2 kg] 121.2 kg (01/03 0400)  Intake/Output from previous day: 01/02 0701 - 01/03 0700 In: 43.5 [I.V.:31; IV Piggyback:12.4] Out: 1090 [Urine:1090] Intake/Output this shift: Total I/O In: 36.9 [I.V.:36.9] Out: 200 [Urine:200]  Awake, attends. Follows commands with upper extremities slowly.    Lab Results: Recent Labs    10/20/18 0800 10/22/18 0510  WBC 10.5 10.1  HGB 9.3* 9.2*  HCT 30.1* 30.4*  PLT 364 398   BMET Recent Labs    10/19/18 2346 10/22/18 0510  NA 143 144  K 3.5 3.9  CL 109 108  CO2 25 27  GLUCOSE 251* 176*  BUN 27* 20  CREATININE 0.79 0.82  CALCIUM 8.4* 8.7*    Studies/Results: No results found.  Assessment/Plan:   LOS: 11 days  Supportive care continues. Ok for Lovenox per Dr. Vertell Limber.   Verdis Prime 10/22/2018, 1:21 PM
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
## 2018-10-22 NOTE — Procedures (Signed)
Cortrak  Person Inserting Tube:  Idelia Salm, RD Tube Type:  Cortrak - 43 inches Tube Location:  Right nare Initial Placement:  Stomach Secured by: Bridle Technique Used to Measure Tube Placement:  Documented cm marking at nare/ corner of mouth Cortrak Secured At:  62 cm    Cortrak Tube Team Note:  Consult received to place a Cortrak feeding tube.   X-ray is required, abdominal x-ray has been ordered by the Cortrak team. Please confirm tube placement before using the Cortrak tube.   If the tube becomes dislodged please keep the tube and contact the Cortrak team at [email or web address] (password [email or web address]) for replacement.  If after hours and replacement cannot be delayed, place a NG tube and confirm placement with an abdominal x-ray.    Gaynell Face, MS, RD, LDN Inpatient Clinical Dietitian Pager: [phone] Weekend/After Hours: [phone]
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
## 2018-10-23 ENCOUNTER — Inpatient Hospital Stay (HOSPITAL_COMMUNITY): Payer: No Typology Code available for payment source

## 2018-10-23 LAB — BASIC METABOLIC PANEL
Anion gap: 6 (ref 5–15)
BUN: 20 mg/dL (ref 6–20)
CO2: 26 mmol/L (ref 22–32)
CREATININE: 0.84 mg/dL (ref 0.61–1.24)
Calcium: 8.7 mg/dL — ABNORMAL LOW (ref 8.9–10.3)
Chloride: 106 mmol/L (ref 98–111)
GFR calc Af Amer: >60 mL/min (ref >60)
GFR calc non Af Amer: >60 mL/min (ref >60)
Glucose, Bld: 301 mg/dL — ABNORMAL HIGH (ref 70–99)
Potassium: 4.1 mmol/L (ref 3.5–5.1)
Sodium: 138 mmol/L (ref 135–145)

## 2018-10-23 LAB — GLUCOSE, CAPILLARY
GLUCOSE-CAPILLARY: 254 mg/dL — AB (ref 70–99)
GLUCOSE-CAPILLARY: 254 mg/dL — AB (ref 70–99)
Glucose-Capillary: 173 mg/dL — ABNORMAL HIGH (ref 70–99)
Glucose-Capillary: 241 mg/dL — ABNORMAL HIGH (ref 70–99)
Glucose-Capillary: 287 mg/dL — ABNORMAL HIGH (ref 70–99)
Glucose-Capillary: 268 mg/dL — ABNORMAL HIGH (ref 70–99)

## 2018-10-23 MED ORDER — IPRATROPIUM-ALBUTEROL 0.5-2.5 (3) MG/3ML IN SOLN
3.0000 mL | Freq: Four times a day (QID) | RESPIRATORY_TRACT | Status: DC | PRN
  Administered 2018-10-25 – 2018-10-28 (×3): 3 mL via RESPIRATORY_TRACT
  Filled 2018-10-23 (×3): qty 3

## 2018-10-23 NOTE — Social Work (Signed)
CSW acknowledging consult for SNF placement. CSW continuing to follow for support with disposition when medically appropriate.  Westley Hummer, MSW, [place] [facility] [phone]

## 2018-10-23 NOTE — Progress Notes (Signed)
Wife at bedside, noted glasses are not present security called to file missing items report.

## 2018-10-23 NOTE — Progress Notes (Signed)
Patient's daughter Randy Carson called for update. Daughter noted that patient is fluent is ASL (patient's mother was deaf), and she hopes this could help patient communicate at this time.
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
## 2018-10-23 NOTE — Progress Notes (Signed)
Tube feeds have not been restarted all shift d/t Pharmacy not sending up replacement feeds, despite numerous requests and phone calls.  Will restart when received.

## 2018-10-24 ENCOUNTER — Inpatient Hospital Stay (HOSPITAL_COMMUNITY): Payer: No Typology Code available for payment source

## 2018-10-24 LAB — EXPECTORATED SPUTUM ASSESSMENT W GRAM STAIN, RFLX TO RESP C

## 2018-10-24 LAB — GLUCOSE, CAPILLARY
GLUCOSE-CAPILLARY: 291 mg/dL — AB (ref 70–99)
Glucose-Capillary: 264 mg/dL — ABNORMAL HIGH (ref 70–99)
Glucose-Capillary: 276 mg/dL — ABNORMAL HIGH (ref 70–99)
Glucose-Capillary: 308 mg/dL — ABNORMAL HIGH (ref 70–99)
Glucose-Capillary: 253 mg/dL — ABNORMAL HIGH (ref 70–99)
Glucose-Capillary: 303 mg/dL — ABNORMAL HIGH (ref 70–99)

## 2018-10-24 LAB — BASIC METABOLIC PANEL
Anion gap: 12 (ref 5–15)
BUN: 22 mg/dL — ABNORMAL HIGH (ref 6–20)
CO2: 25 mmol/L (ref 22–32)
Calcium: 8.8 mg/dL — ABNORMAL LOW (ref 8.9–10.3)
Chloride: 103 mmol/L (ref 98–111)
Creatinine, Ser: 0.89 mg/dL (ref 0.61–1.24)
Glucose, Bld: 331 mg/dL — ABNORMAL HIGH (ref 70–99)
POTASSIUM: 4 mmol/L (ref 3.5–5.1)
Sodium: 140 mmol/L (ref 135–145)

## 2018-10-24 MED ORDER — INSULIN DETEMIR 100 UNIT/ML ~~LOC~~ SOLN
20.0000 [IU] | Freq: Two times a day (BID) | SUBCUTANEOUS | Status: DC
  Administered 2018-10-24 – 2018-10-25 (×2): 20 [IU] via SUBCUTANEOUS
  Filled 2018-10-24 (×2): qty 0.2

## 2018-10-24 MED ORDER — PHENOL 1.4 % MT LIQD: 1.0000 | OROMUCOSAL | Status: DC | PRN

## 2018-10-24 MED ORDER — METOPROLOL TARTRATE 25 MG/10 ML ORAL SUSPENSION
25.0000 mg | Freq: Two times a day (BID) | ORAL | Status: DC
  Administered 2018-10-24 – 2018-10-26 (×5): 25 mg via ORAL
  Filled 2018-10-24 (×6): qty 10
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
## 2018-10-24 NOTE — Progress Notes (Signed)
2113: Spoke w/ daughter Lonn Georgia), gave a thorough update on pt. Daughter also questioning CSW consult for SNF. Daughter stated that they would like to discuss as they are not happy with that option. She expressed their hope for him to return home as he would have 24hr care even in current state.

## 2018-10-25 ENCOUNTER — Other Ambulatory Visit: Payer: Self-pay

## 2018-10-25 ENCOUNTER — Encounter (HOSPITAL_COMMUNITY): Payer: Self-pay | Admitting: *Deleted

## 2018-10-25 LAB — GLUCOSE, CAPILLARY
GLUCOSE-CAPILLARY: 332 mg/dL — AB (ref 70–99)
GLUCOSE-CAPILLARY: 368 mg/dL — AB (ref 70–99)
Glucose-Capillary: 350 mg/dL — ABNORMAL HIGH (ref 70–99)
Glucose-Capillary: 344 mg/dL — ABNORMAL HIGH (ref 70–99)
Glucose-Capillary: 300 mg/dL — ABNORMAL HIGH (ref 70–99)

## 2018-10-25 LAB — COMPREHENSIVE METABOLIC PANEL
ALT: 26 U/L (ref 0–44)
AST: 22 U/L (ref 15–41)
Albumin: 2.7 g/dL — ABNORMAL LOW (ref 3.5–5.0)
Alkaline Phosphatase: 72 U/L (ref 38–126)
Anion gap: 8 (ref 5–15)
BUN: 25 mg/dL — ABNORMAL HIGH (ref 6–20)
CO2: 26 mmol/L (ref 22–32)
Calcium: 8.8 mg/dL — ABNORMAL LOW (ref 8.9–10.3)
Chloride: 107 mmol/L (ref 98–111)
Creatinine, Ser: 0.94 mg/dL (ref 0.61–1.24)
GFR calc non Af Amer: >60 mL/min (ref >60)
Glucose, Bld: 337 mg/dL — ABNORMAL HIGH (ref 70–99)
Potassium: 4.3 mmol/L (ref 3.5–5.1)
SODIUM: 141 mmol/L (ref 135–145)
Total Bilirubin: 0.6 mg/dL (ref 0.3–1.2)
Total Protein: 7.1 g/dL (ref 6.5–8.1)

## 2018-10-25 LAB — CBC
HCT: 34.6 % — ABNORMAL LOW (ref 39.0–52.0)
HEMOGLOBIN: 10.6 g/dL — AB (ref 13.0–17.0)
MCH: 29.4 pg (ref 26.0–34.0)
MCHC: 30.6 g/dL (ref 30.0–36.0)
MCV: 96.1 fL (ref 80.0–100.0)
Platelets: 494 10*3/uL — ABNORMAL HIGH (ref 150–400)
RBC: 3.6 MIL/uL — ABNORMAL LOW (ref 4.22–5.81)
RDW: 13.7 % (ref 11.5–15.5)
WBC: 9.5 10*3/uL (ref 4.0–10.5)
nRBC: 0 % (ref 0.0–0.2)

## 2018-10-25 MED ORDER — INSULIN ASPART 100 UNIT/ML ~~LOC~~ SOLN
6.0000 [IU] | SUBCUTANEOUS | Status: DC
  Administered 2018-10-25 – 2018-10-28 (×18): 6 [IU] via SUBCUTANEOUS

## 2018-10-25 MED ORDER — INSULIN DETEMIR 100 UNIT/ML ~~LOC~~ SOLN
25.0000 [IU] | Freq: Two times a day (BID) | SUBCUTANEOUS | Status: DC
  Administered 2018-10-25 – 2018-10-27 (×5): 25 [IU] via SUBCUTANEOUS
  Filled 2018-10-25 (×9): qty 0.25

## 2018-10-25 MED ORDER — GUAIFENESIN 100 MG/5ML PO SOLN
15.0000 mL | Freq: Four times a day (QID) | ORAL | Status: DC
  Administered 2018-10-25 – 2018-11-24 (×105): 300 mg
  Filled 2018-10-25 (×3): qty 20
  Filled 2018-10-25: qty 10
  Filled 2018-10-25 (×8): qty 20
  Filled 2018-10-25: qty 10
  Filled 2018-10-25 (×6): qty 20
  Filled 2018-10-25: qty 10
  Filled 2018-10-25 (×17): qty 20
  Filled 2018-10-25: qty 150
  Filled 2018-10-25 (×16): qty 20
  Filled 2018-10-25: qty 10
  Filled 2018-10-25 (×3): qty 20
  Filled 2018-10-25: qty 10
  Filled 2018-10-25 (×5): qty 20
  Filled 2018-10-25: qty 30
  Filled 2018-10-25: qty 20
  Filled 2018-10-25: qty 10
  Filled 2018-10-25 (×11): qty 20
  Filled 2018-10-25: qty 10
  Filled 2018-10-25 (×3): qty 20
  Filled 2018-10-25: qty 10
  Filled 2018-10-25 (×6): qty 20
  Filled 2018-10-25: qty 10
  Filled 2018-10-25: qty 20
  Filled 2018-10-25: qty 10
  Filled 2018-10-25: qty 20
  Filled 2018-10-25 (×2): qty 10
  Filled 2018-10-25 (×2): qty 20
  Filled 2018-10-25: qty 10
  Filled 2018-10-25 (×7): qty 20
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
## 2018-10-25 NOTE — Progress Notes (Signed)
Daughter Nira Conn called 1000 for update, update given.  Wife [name] called 10 for update, given.

## 2018-10-25 NOTE — Progress Notes (Addendum)
Inpatient Diabetes Program Recommendations  AACE/ADA: New Consensus Statement on Inpatient Glycemic Control (2015)  Target Ranges:  Prepandial:   less than 140 mg/dL      Peak postprandial:   less than 180 mg/dL (1-2 hours)      Critically ill patients:  140 - 180 mg/dL   Lab Results  Component Value Date   GLUCAP 332 (H) 10/25/2018    Review of Glycemic Control Results for GWIN, EAGON (MRN [id number]) as of 10/25/2018 08:13  Ref. Range 10/24/2018 16:05 10/24/2018 19:35 10/24/2018 23:13 10/25/2018 03:25 10/25/2018 07:43  Glucose-Capillary Latest Ref Range: 70 - 99 mg/dL 253 (H) 303 (H) 291 (H) 350 (H) 332 (H)   Inpatient Diabetes Program Recommendations:   Please consider  -adding Novolog 6 units meal coverage q 4 hrs and hold if feeding held or stopped for any reason. -Increase Levemir to 25 units daily.  Thank you, Nani Gasser. Oswell Say, RN, MSN, CDE  Diabetes Coordinator Inpatient Glycemic Control Team Team Pager [phone] (8am-5pm) 10/25/2018 8:15 AM
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
## 2018-10-25 NOTE — Progress Notes (Addendum)
Subjective: Patient reports (nonverbal)  Objective: Vital signs in last 24 hours: Temp:  [98.7 F (37.1 C)-100.5 F (38.1 C)] 99.3 F (37.4 C) (01/06 0740) Pulse Rate:  [85-102] 102 (01/06 0740) Resp:  [13-25] 20 (01/06 0740) BP: (127-149)/(64-80) 139/75 (01/06 0740) SpO2:  [95 %-98 %] 96 % (01/06 0740)  Intake/Output from previous day: 01/05 0701 - 01/06 0700 In: 1628.7 [NG/GT:1628.7] Out: 1775 [Urine:1775] Intake/Output this shift: Total I/O In: -  Out: 325 [Urine:325]  Awake, attends intermittently. Follows some commands with upper extremities slowly.   Lab Results: Recent Labs    10/25/18 0620  WBC 9.5  HGB 10.6*  HCT 34.6*  PLT 494*   BMET Recent Labs    10/24/18 0612 10/25/18 0620  NA 140 141  K 4.0 4.3  CL 103 107  CO2 25 PENDING  GLUCOSE 331* 337*  BUN 22* 25*  CREATININE 0.89 0.94  CALCIUM 8.8* 8.8*    Studies/Results: Dg Chest Port 1 View  Result Date: 10/24/2018 CLINICAL DATA:  Cough and fever. EXAM: PORTABLE CHEST 1 VIEW COMPARISON:  Single-view of the chest 10/23/2018 and 10/20/2018. FINDINGS: Feeding tube and right Port-A-Cath are in place. Lung volumes are low. Left basilar airspace appears mildly improved compared to yesterday's examination. The right lung is clear. No pneumothorax or pleural effusion. Heart size is enlarged. IMPRESSION: Improved left basilar airspace disease compatible with decreased atelectasis or improved pneumonia. No new abnormality. Electronically Signed   By: Inge Rise M.D.   On: 10/24/2018 12:29    Assessment/Plan:   LOS: 14 days  Supportive care continues   Verdis Prime 10/25/2018, 8:24 AM  Initiate therapies as patient is able to tolerate

## 2018-10-26 ENCOUNTER — Inpatient Hospital Stay (HOSPITAL_COMMUNITY): Payer: No Typology Code available for payment source

## 2018-10-26 LAB — CBC
HCT: 28.1 % — ABNORMAL LOW (ref 39.0–52.0)
HEMOGLOBIN: 8.3 g/dL — AB (ref 13.0–17.0)
MCH: 28.9 pg (ref 26.0–34.0)
MCHC: 29.5 g/dL — AB (ref 30.0–36.0)
MCV: 97.9 fL (ref 80.0–100.0)
Platelets: 413 10*3/uL — ABNORMAL HIGH (ref 150–400)
RBC: 2.87 MIL/uL — ABNORMAL LOW (ref 4.22–5.81)
RDW: 14 % (ref 11.5–15.5)
WBC: 8.8 10*3/uL (ref 4.0–10.5)
nRBC: 0 % (ref 0.0–0.2)

## 2018-10-26 LAB — CULTURE, RESPIRATORY W GRAM STAIN: Culture: NORMAL

## 2018-10-26 LAB — GLUCOSE, CAPILLARY
GLUCOSE-CAPILLARY: 365 mg/dL — AB (ref 70–99)
Glucose-Capillary: 303 mg/dL — ABNORMAL HIGH (ref 70–99)
Glucose-Capillary: 314 mg/dL — ABNORMAL HIGH (ref 70–99)
Glucose-Capillary: 326 mg/dL — ABNORMAL HIGH (ref 70–99)
Glucose-Capillary: 335 mg/dL — ABNORMAL HIGH (ref 70–99)
Glucose-Capillary: 342 mg/dL — ABNORMAL HIGH (ref 70–99)
Glucose-Capillary: 363 mg/dL — ABNORMAL HIGH (ref 70–99)

## 2018-10-26 MED ORDER — GLUCERNA 1.2 CAL PO LIQD
1000.0000 mL | ORAL | Status: DC
  Administered 2018-10-26 – 2018-11-17 (×22): 1000 mL
  Filled 2018-10-26 (×40): qty 1000

## 2018-10-26 MED ORDER — METOPROLOL TARTRATE 25 MG/10 ML ORAL SUSPENSION
25.0000 mg | Freq: Two times a day (BID) | ORAL | Status: DC
  Administered 2018-10-26 – 2018-11-26 (×59): 25 mg
  Filled 2018-10-26 (×64): qty 10

## 2018-10-26 MED ORDER — HEPARIN SOD (PORK) LOCK FLUSH 100 UNIT/ML IV SOLN
500.0000 [IU] | INTRAVENOUS | Status: AC | PRN
  Administered 2018-10-26: 500 [IU]
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
## 2018-10-26 NOTE — Progress Notes (Signed)
Inpatient Diabetes Program Recommendations  AACE/ADA: New Consensus Statement on Inpatient Glycemic Control  Target Ranges:  Prepandial:   less than 140 mg/dL      Peak postprandial:   less than 180 mg/dL (1-2 hours)      Critically ill patients:  140 - 180 mg/dL   Results for ISSIAH, HUFFAKER (MRN [id number]) as of 10/26/2018 10:38  Ref. Range 10/25/2018 07:43 10/25/2018 11:49 10/25/2018 16:11 10/25/2018 19:44 10/26/2018 00:50 10/26/2018 05:13 10/26/2018 07:48  Glucose-Capillary Latest Ref Range: 70 - 99 mg/dL 332 (H) 368 (H) 344 (H) 300 (H) 335 (H) 365 (H) 342 (H)   Review of Glycemic Control  Diabetes history: DM2 Outpatient Diabetes medications: Basaglar 50 units daily, Metformin 1000 mg BID  Current orders for Inpatient glycemic control: Levemir 25 units BID, Novolog 6 units Q4H, Novolog 0-20 units Q4H  Inpatient Diabetes Program Recommendations:   Insulin - IV drip/GlucoStabilizer: Glucose has been consistently elevated despite increase in Levemir and addition of Novolog tube feeding coverage on 10/25/18. Recommend discontinuing all insulin orders and use IV insulin drip per GlucoStabilizer order set to improve glycemic control and help determine actual insulin needs.  Thanks, Barnie Alderman, RN, MSN, CDE Diabetes Coordinator Inpatient Diabetes Program [phone] (Team Pager from 8am to 5pm)
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
## 2018-10-26 NOTE — Progress Notes (Signed)
Rt called to NTS pt.  Pt rattling and congested. During NTS, copious amounts of white/yellow secretions removed.  RT also used yaunker to remove secretions from the back of pt's throat. Pt sounds better but is still rattling.  RT will monitor.

## 2018-10-26 NOTE — Progress Notes (Signed)
Patient is very congested. Chest xray shows improvement but still is coughing up thick yellow secretions. Called respiratory to get an evaluation and see if he needs to be naso-tracheal suctioned. If they agree I will call doctor about his fever and congestion and request antibiotic for pneumonia.  Also wife wants to know if he will get antibiotic. Will notify in the morning about his status. Marlene Bast, RN

## 2018-10-26 NOTE — Progress Notes (Addendum)
Subjective: Patient reports (nonverbal)  Objective: Vital signs in last 24 hours: Temp:  [98.4 F (36.9 C)-100.9 F (38.3 C)] 100.9 F (38.3 C) (01/07 0000) Pulse Rate:  [90-105] 102 (01/07 0000) Resp:  [18-25] 18 (01/07 0000) BP: (126-136)/(61-72) 131/65 (01/07 0000) SpO2:  [93 %-98 %] 96 % (01/07 0000) Weight:  [120 kg] 120 kg (01/07 0700)  Intake/Output from previous day: 01/06 0701 - 01/07 0700 In: 1755.8 [NG/GT:1755.8] Out: 2075 [Urine:2075] Intake/Output this shift: No intake/output data recorded.  Does not speak, but attends. Follows commands BUE. Not moving lower extremities this am. Some chest congestion, clears with cough.  Lab Results: Recent Labs    10/25/18 0620  WBC 9.5  HGB 10.6*  HCT 34.6*  PLT 494*   BMET Recent Labs    10/24/18 0612 10/25/18 0620  NA 140 141  K 4.0 4.3  CL 103 107  CO2 25 26  GLUCOSE 331* 337*  BUN 22* 25*  CREATININE 0.89 0.94  CALCIUM 8.8* 8.8*    Studies/Results: Dg Chest Port 1 View  Result Date: 10/24/2018 CLINICAL DATA:  Cough and fever. EXAM: PORTABLE CHEST 1 VIEW COMPARISON:  Single-view of the chest 10/23/2018 and 10/20/2018. FINDINGS: Feeding tube and right Port-A-Cath are in place. Lung volumes are low. Left basilar airspace appears mildly improved compared to yesterday's examination. The right lung is clear. No pneumothorax or pleural effusion. Heart size is enlarged. IMPRESSION: Improved left basilar airspace disease compatible with decreased atelectasis or improved pneumonia. No new abnormality. Electronically Signed   By: Inge Rise M.D.   On: 10/24/2018 12:29    Assessment/Plan:   LOS: 15 days  supportive care continues   Verdis Prime 10/26/2018, 7:49 AM  Mobilize as tolerated.
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
## 2018-10-27 ENCOUNTER — Encounter (HOSPITAL_COMMUNITY): Payer: Self-pay | Admitting: *Deleted

## 2018-10-27 DIAGNOSIS — L8990 Pressure ulcer of unspecified site, unspecified stage: Secondary | ICD-10-CM

## 2018-10-27 LAB — URINALYSIS, ROUTINE W REFLEX MICROSCOPIC
BILIRUBIN URINE: NEGATIVE
Glucose, UA: >=500 mg/dL — AB
HGB URINE DIPSTICK: NEGATIVE
Ketones, ur: NEGATIVE mg/dL
Leukocytes, UA: NEGATIVE
Nitrite: NEGATIVE
Protein, ur: NEGATIVE mg/dL
Specific Gravity, Urine: 1.026 (ref 1.005–1.030)
pH: 5 (ref 5.0–8.0)

## 2018-10-27 LAB — CBC
HCT: 38.2 % — ABNORMAL LOW (ref 39.0–52.0)
Hemoglobin: 11.6 g/dL — ABNORMAL LOW (ref 13.0–17.0)
MCH: 29.1 pg (ref 26.0–34.0)
MCHC: 30.4 g/dL (ref 30.0–36.0)
MCV: 96 fL (ref 80.0–100.0)
Platelets: 561 10*3/uL — ABNORMAL HIGH (ref 150–400)
RBC: 3.98 MIL/uL — ABNORMAL LOW (ref 4.22–5.81)
RDW: 14.1 % (ref 11.5–15.5)
WBC: 13 10*3/uL — ABNORMAL HIGH (ref 4.0–10.5)
nRBC: 0 % (ref 0.0–0.2)

## 2018-10-27 LAB — GLUCOSE, CAPILLARY
GLUCOSE-CAPILLARY: 285 mg/dL — AB (ref 70–99)
Glucose-Capillary: 260 mg/dL — ABNORMAL HIGH (ref 70–99)
Glucose-Capillary: 281 mg/dL — ABNORMAL HIGH (ref 70–99)
Glucose-Capillary: 320 mg/dL — ABNORMAL HIGH (ref 70–99)
Glucose-Capillary: 307 mg/dL — ABNORMAL HIGH (ref 70–99)
Glucose-Capillary: 312 mg/dL — ABNORMAL HIGH (ref 70–99)

## 2018-10-27 MED ORDER — PRO-STAT SUGAR FREE PO LIQD
30.0000 mL | Freq: Two times a day (BID) | ORAL | Status: DC
  Administered 2018-10-27 – 2018-11-17 (×42): 30 mL
  Filled 2018-10-27 (×42): qty 30

## 2018-10-27 NOTE — Progress Notes (Signed)
Inpatient Diabetes Program Recommendations  AACE/ADA: New Consensus Statement on Inpatient Glycemic Control (2015)  Target Ranges:  Prepandial:   less than 140 mg/dL      Peak postprandial:   less than 180 mg/dL (1-2 hours)      Critically ill patients:  140 - 180 mg/dL   Lab Results  Component Value Date   GLUCAP 320 (H) 10/27/2018    Review of Glycemic Control Results for Randy Carson, Randy Carson (MRN [id number]) as of 10/27/2018 13:21  Ref. Range 10/26/2018 19:26 10/26/2018 23:22 10/27/2018 03:55 10/27/2018 08:09 10/27/2018 12:04  Glucose-Capillary Latest Ref Range: 70 - 99 mg/dL 303 (H) 314 (H) 260 (H) 281 (H) 320 (H)   Inpatient Diabetes Program Recommendations: Insulin - IV drip/GlucoStabilizer: If glucose continues to be consistently elevated despite increase in Levemir and Novolog tube feeding coverage, recommend discontinuing all insulin orders and use IV insulin drip per GlucoStabilizer order set. Insulin - Basal: Please consider increasing Levemir to 30 units BID. Insulin - Meal Coverage: Please consider increasing tube feeding coverage to Novolog 12 units Q4H.  Thank you, Nani Gasser. Arturo Freundlich, RN, MSN, CDE  Diabetes Coordinator Inpatient Glycemic Control Team Team Pager [phone] (8am-5pm) 10/27/2018 1:21 PM
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
## 2018-10-27 NOTE — Progress Notes (Signed)
Patient working with therapies, patient is very lethargic and hard to arouse. OT mentioned that he spiked a fever yesterday and asked about antibiotics? Possible UTI?  I will alert his Nurse to contact Physician regarding.

## 2018-10-28 ENCOUNTER — Inpatient Hospital Stay (HOSPITAL_COMMUNITY): Payer: No Typology Code available for payment source

## 2018-10-28 LAB — BASIC METABOLIC PANEL
Anion gap: 11 (ref 5–15)
BUN: 50 mg/dL — ABNORMAL HIGH (ref 6–20)
CALCIUM: 9.6 mg/dL (ref 8.9–10.3)
CO2: 26 mmol/L (ref 22–32)
Chloride: 108 mmol/L (ref 98–111)
Creatinine, Ser: 1.22 mg/dL (ref 0.61–1.24)
GFR calc Af Amer: >60 mL/min (ref >60)
GFR calc non Af Amer: >60 mL/min (ref >60)
Glucose, Bld: 331 mg/dL — ABNORMAL HIGH (ref 70–99)
Potassium: 4.7 mmol/L (ref 3.5–5.1)
Sodium: 145 mmol/L (ref 135–145)

## 2018-10-28 LAB — GLUCOSE, CAPILLARY
GLUCOSE-CAPILLARY: 213 mg/dL — AB (ref 70–99)
Glucose-Capillary: 289 mg/dL — ABNORMAL HIGH (ref 70–99)
Glucose-Capillary: 299 mg/dL — ABNORMAL HIGH (ref 70–99)
Glucose-Capillary: 246 mg/dL — ABNORMAL HIGH (ref 70–99)
Glucose-Capillary: 301 mg/dL — ABNORMAL HIGH (ref 70–99)
Glucose-Capillary: 290 mg/dL — ABNORMAL HIGH (ref 70–99)
Glucose-Capillary: 280 mg/dL — ABNORMAL HIGH (ref 70–99)
Glucose-Capillary: 298 mg/dL — ABNORMAL HIGH (ref 70–99)
Glucose-Capillary: 263 mg/dL — ABNORMAL HIGH (ref 70–99)
Glucose-Capillary: 207 mg/dL — ABNORMAL HIGH (ref 70–99)
Glucose-Capillary: 180 mg/dL — ABNORMAL HIGH (ref 70–99)

## 2018-10-28 LAB — CBC
HCT: 39.1 % (ref 39.0–52.0)
Hemoglobin: 12.3 g/dL — ABNORMAL LOW (ref 13.0–17.0)
MCH: 30.2 pg (ref 26.0–34.0)
MCHC: 31.5 g/dL (ref 30.0–36.0)
MCV: 96.1 fL (ref 80.0–100.0)
Platelets: 585 10*3/uL — ABNORMAL HIGH (ref 150–400)
RBC: 4.07 MIL/uL — ABNORMAL LOW (ref 4.22–5.81)
RDW: 14.1 % (ref 11.5–15.5)
WBC: 15.2 10*3/uL — ABNORMAL HIGH (ref 4.0–10.5)
nRBC: 0 % (ref 0.0–0.2)

## 2018-10-28 MED ORDER — INSULIN REGULAR BOLUS VIA INFUSION
0.0000 [IU] | Freq: Three times a day (TID) | INTRAVENOUS | Status: DC
  Filled 2018-10-28: qty 10

## 2018-10-28 MED ORDER — SODIUM CHLORIDE 0.9 % IV SOLN
INTRAVENOUS | Status: DC
  Administered 2018-10-28: 11:00:00 via INTRAVENOUS

## 2018-10-28 MED ORDER — DEXTROSE 50 % IV SOLN: 25.0000 mL | INTRAVENOUS | Status: DC | PRN

## 2018-10-28 MED ORDER — DEXTROSE-NACL 5-0.45 % IV SOLN
INTRAVENOUS | Status: DC
  Administered 2018-10-28: 21:00:00 via INTRAVENOUS

## 2018-10-28 MED ORDER — SODIUM CHLORIDE 0.9 % IV SOLN
2.0000 g | Freq: Two times a day (BID) | INTRAVENOUS | Status: DC
  Administered 2018-10-28 – 2018-10-29 (×3): 2 g via INTRAVENOUS
  Filled 2018-10-28 (×3): qty 2

## 2018-10-28 MED ORDER — IPRATROPIUM-ALBUTEROL 0.5-2.5 (3) MG/3ML IN SOLN
3.0000 mL | Freq: Three times a day (TID) | RESPIRATORY_TRACT | Status: DC
  Administered 2018-10-29 – 2018-11-02 (×13): 3 mL via RESPIRATORY_TRACT
  Filled 2018-10-28 (×14): qty 3

## 2018-10-28 MED ORDER — VANCOMYCIN HCL 10 G IV SOLR
2000.0000 mg | Freq: Once | INTRAVENOUS | Status: AC
  Administered 2018-10-28: 2000 mg via INTRAVENOUS
  Filled 2018-10-28: qty 2000

## 2018-10-28 MED ORDER — INSULIN REGULAR(HUMAN) IN NACL 100-0.9 UT/100ML-% IV SOLN
INTRAVENOUS | Status: DC
  Administered 2018-10-28: 9.6 [IU]/h via INTRAVENOUS
  Administered 2018-10-28: 1.9 [IU]/h via INTRAVENOUS
  Administered 2018-10-29: 10.2 [IU]/h via INTRAVENOUS
  Filled 2018-10-28 (×4): qty 100

## 2018-10-28 MED ORDER — IPRATROPIUM-ALBUTEROL 0.5-2.5 (3) MG/3ML IN SOLN
3.0000 mL | Freq: Four times a day (QID) | RESPIRATORY_TRACT | Status: DC
  Administered 2018-10-28 (×2): 3 mL via RESPIRATORY_TRACT
  Filled 2018-10-28 (×2): qty 3

## 2018-10-28 MED ORDER — VANCOMYCIN HCL 10 G IV SOLR: 1750.0000 mg | Freq: Two times a day (BID) | INTRAVENOUS | Status: DC

## 2018-10-28 MED ORDER — SODIUM CHLORIDE 0.9 % IV SOLN: INTRAVENOUS | Status: DC
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
## 2018-10-28 NOTE — Progress Notes (Signed)
Called Pharmacy and spoke with Pharmacist to clarify orders with Glucostablizer to continue Insulin Drip until CBG had met maxim goal  below 180 for (4) hrs consistently then call Trauma MD for further orders.
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
## 2018-10-28 NOTE — Progress Notes (Signed)
Vital Signs/temp/RR reported to PA. She spoke with wife on the phone regarding condition and plan of care. Simmie Davies RN

## 2018-10-28 NOTE — Progress Notes (Signed)
Called and talked to wife about plan of care for patient.   [place], [facility] Pager [phone]
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
## 2018-10-29 LAB — GLUCOSE, CAPILLARY
GLUCOSE-CAPILLARY: 195 mg/dL — AB (ref 70–99)
Glucose-Capillary: 173 mg/dL — ABNORMAL HIGH (ref 70–99)
Glucose-Capillary: 186 mg/dL — ABNORMAL HIGH (ref 70–99)
Glucose-Capillary: 169 mg/dL — ABNORMAL HIGH (ref 70–99)
Glucose-Capillary: 157 mg/dL — ABNORMAL HIGH (ref 70–99)
Glucose-Capillary: 175 mg/dL — ABNORMAL HIGH (ref 70–99)
Glucose-Capillary: 162 mg/dL — ABNORMAL HIGH (ref 70–99)
Glucose-Capillary: 148 mg/dL — ABNORMAL HIGH (ref 70–99)
Glucose-Capillary: 154 mg/dL — ABNORMAL HIGH (ref 70–99)
Glucose-Capillary: 219 mg/dL — ABNORMAL HIGH (ref 70–99)
Glucose-Capillary: 186 mg/dL — ABNORMAL HIGH (ref 70–99)
Glucose-Capillary: 205 mg/dL — ABNORMAL HIGH (ref 70–99)
Glucose-Capillary: 247 mg/dL — ABNORMAL HIGH (ref 70–99)
Glucose-Capillary: 261 mg/dL — ABNORMAL HIGH (ref 70–99)

## 2018-10-29 LAB — CBC
HEMATOCRIT: 37.3 % — AB (ref 39.0–52.0)
Hemoglobin: 11.5 g/dL — ABNORMAL LOW (ref 13.0–17.0)
MCH: 30.3 pg (ref 26.0–34.0)
MCHC: 30.8 g/dL (ref 30.0–36.0)
MCV: 98.2 fL (ref 80.0–100.0)
Platelets: 437 10*3/uL — ABNORMAL HIGH (ref 150–400)
RBC: 3.8 MIL/uL — ABNORMAL LOW (ref 4.22–5.81)
RDW: 14.4 % (ref 11.5–15.5)
WBC: 15.9 10*3/uL — ABNORMAL HIGH (ref 4.0–10.5)
nRBC: 0 % (ref 0.0–0.2)

## 2018-10-29 LAB — CULTURE, RESPIRATORY W GRAM STAIN: Culture: NORMAL

## 2018-10-29 LAB — BASIC METABOLIC PANEL
Anion gap: 8 (ref 5–15)
BUN: 43 mg/dL — ABNORMAL HIGH (ref 6–20)
CHLORIDE: 116 mmol/L — AB (ref 98–111)
CO2: 23 mmol/L (ref 22–32)
Calcium: 9.1 mg/dL (ref 8.9–10.3)
Creatinine, Ser: 1.28 mg/dL — ABNORMAL HIGH (ref 0.61–1.24)
GFR calc Af Amer: >60 mL/min (ref >60)
GFR calc non Af Amer: >60 mL/min (ref >60)
Glucose, Bld: 237 mg/dL — ABNORMAL HIGH (ref 70–99)
Potassium: 5.1 mmol/L (ref 3.5–5.1)
Sodium: 147 mmol/L — ABNORMAL HIGH (ref 135–145)

## 2018-10-29 LAB — URINE CULTURE: Culture: 10000 — AB

## 2018-10-29 MED ORDER — CHLORHEXIDINE GLUCONATE CLOTH 2 % EX PADS
6.0000 | MEDICATED_PAD | Freq: Every day | CUTANEOUS | Status: DC
  Administered 2018-10-29 – 2018-11-06 (×9): 6 via TOPICAL

## 2018-10-29 MED ORDER — INSULIN ASPART 100 UNIT/ML ~~LOC~~ SOLN
0.0000 [IU] | SUBCUTANEOUS | Status: DC
  Administered 2018-10-29: 7 [IU] via SUBCUTANEOUS
  Administered 2018-10-29: 4 [IU] via SUBCUTANEOUS
  Administered 2018-10-29: 11 [IU] via SUBCUTANEOUS
  Administered 2018-10-29 – 2018-10-30 (×4): 7 [IU] via SUBCUTANEOUS
  Administered 2018-10-30: 4 [IU] via SUBCUTANEOUS
  Administered 2018-10-30: 7 [IU] via SUBCUTANEOUS
  Administered 2018-10-30: 11 [IU] via SUBCUTANEOUS
  Administered 2018-10-30 – 2018-10-31 (×5): 4 [IU] via SUBCUTANEOUS
  Administered 2018-10-31: 7 [IU] via SUBCUTANEOUS
  Administered 2018-10-31: 4 [IU] via SUBCUTANEOUS
  Administered 2018-11-01: 3 [IU] via SUBCUTANEOUS
  Administered 2018-11-01 (×3): 4 [IU] via SUBCUTANEOUS
  Administered 2018-11-02: 6 [IU] via SUBCUTANEOUS
  Administered 2018-11-02 (×2): 4 [IU] via SUBCUTANEOUS
  Administered 2018-11-02 (×2): 3 [IU] via SUBCUTANEOUS
  Administered 2018-11-03: 4 [IU] via SUBCUTANEOUS
  Administered 2018-11-03: 3 [IU] via SUBCUTANEOUS
  Administered 2018-11-03: 4 [IU] via SUBCUTANEOUS
  Administered 2018-11-03 (×2): 3 [IU] via SUBCUTANEOUS
  Administered 2018-11-04 (×2): 4 [IU] via SUBCUTANEOUS
  Administered 2018-11-04 (×2): 3 [IU] via SUBCUTANEOUS
  Administered 2018-11-04: 7 [IU] via SUBCUTANEOUS
  Administered 2018-11-04 – 2018-11-05 (×3): 3 [IU] via SUBCUTANEOUS
  Administered 2018-11-05: 4 [IU] via SUBCUTANEOUS
  Administered 2018-11-05: 7 [IU] via SUBCUTANEOUS
  Administered 2018-11-05 (×2): 3 [IU] via SUBCUTANEOUS
  Administered 2018-11-05 – 2018-11-06 (×2): 4 [IU] via SUBCUTANEOUS
  Administered 2018-11-06 (×2): 3 [IU] via SUBCUTANEOUS
  Administered 2018-11-06 – 2018-11-07 (×7): 4 [IU] via SUBCUTANEOUS
  Administered 2018-11-07 – 2018-11-08 (×3): 3 [IU] via SUBCUTANEOUS
  Administered 2018-11-08 (×4): 4 [IU] via SUBCUTANEOUS
  Administered 2018-11-08 – 2018-11-09 (×2): 3 [IU] via SUBCUTANEOUS
  Administered 2018-11-09: 4 [IU] via SUBCUTANEOUS
  Administered 2018-11-09: 3 [IU] via SUBCUTANEOUS
  Administered 2018-11-09: 4 [IU] via SUBCUTANEOUS
  Administered 2018-11-10: 3 [IU] via SUBCUTANEOUS
  Administered 2018-11-10: 4 [IU] via SUBCUTANEOUS
  Administered 2018-11-10: 3 [IU] via SUBCUTANEOUS
  Administered 2018-11-11 (×5): 4 [IU] via SUBCUTANEOUS
  Administered 2018-11-11: 6 [IU] via SUBCUTANEOUS
  Administered 2018-11-12: 3 [IU] via SUBCUTANEOUS
  Administered 2018-11-12: 4 [IU] via SUBCUTANEOUS
  Administered 2018-11-12 (×3): 3 [IU] via SUBCUTANEOUS
  Administered 2018-11-12 – 2018-11-13 (×2): 7 [IU] via SUBCUTANEOUS
  Administered 2018-11-13: 4 [IU] via SUBCUTANEOUS
  Administered 2018-11-14: 3 [IU] via SUBCUTANEOUS
  Administered 2018-11-14 (×6): 4 [IU] via SUBCUTANEOUS
  Administered 2018-11-15: 3 [IU] via SUBCUTANEOUS
  Administered 2018-11-15 (×2): 4 [IU] via SUBCUTANEOUS
  Administered 2018-11-16: 7 [IU] via SUBCUTANEOUS
  Administered 2018-11-16: 3 [IU] via SUBCUTANEOUS
  Administered 2018-11-16 (×3): 4 [IU] via SUBCUTANEOUS
  Administered 2018-11-16: 7 [IU] via SUBCUTANEOUS
  Administered 2018-11-17: 4 [IU] via SUBCUTANEOUS
  Administered 2018-11-17: 3 [IU] via SUBCUTANEOUS
  Administered 2018-11-17 (×2): 4 [IU] via SUBCUTANEOUS
  Administered 2018-11-18: 7 [IU] via SUBCUTANEOUS
  Administered 2018-11-18 (×2): 3 [IU] via SUBCUTANEOUS
  Administered 2018-11-19: 4 [IU] via SUBCUTANEOUS
  Administered 2018-11-19: 3 [IU] via SUBCUTANEOUS
  Administered 2018-11-19: 4 [IU] via SUBCUTANEOUS
  Administered 2018-11-20: 3 [IU] via SUBCUTANEOUS
  Administered 2018-11-20: 7 [IU] via SUBCUTANEOUS
  Administered 2018-11-20 (×2): 4 [IU] via SUBCUTANEOUS
  Administered 2018-11-21 (×3): 3 [IU] via SUBCUTANEOUS
  Administered 2018-11-21: 4 [IU] via SUBCUTANEOUS
  Administered 2018-11-22 – 2018-11-23 (×5): 3 [IU] via SUBCUTANEOUS
  Administered 2018-11-24: 4 [IU] via SUBCUTANEOUS
  Administered 2018-11-25: 3 [IU] via SUBCUTANEOUS

## 2018-10-29 MED ORDER — SODIUM CHLORIDE 0.45 % IV SOLN
INTRAVENOUS | Status: DC
  Administered 2018-10-29 – 2018-11-09 (×17): via INTRAVENOUS

## 2018-10-29 MED ORDER — SODIUM CHLORIDE 0.9 % IV SOLN
INTRAVENOUS | Status: DC
  Administered 2018-10-29: 10:00:00 via INTRAVENOUS

## 2018-10-29 MED ORDER — INSULIN DETEMIR 100 UNIT/ML ~~LOC~~ SOLN
25.0000 [IU] | Freq: Two times a day (BID) | SUBCUTANEOUS | Status: DC
  Administered 2018-10-29 – 2018-10-31 (×6): 25 [IU] via SUBCUTANEOUS
  Filled 2018-10-29 (×7): qty 0.25

## 2018-10-29 MED ORDER — INSULIN ASPART 100 UNIT/ML ~~LOC~~ SOLN
6.0000 [IU] | SUBCUTANEOUS | Status: DC
  Administered 2018-10-29 – 2018-11-26 (×128): 6 [IU] via SUBCUTANEOUS

## 2018-10-29 MED ORDER — SODIUM CHLORIDE 0.9 % IV SOLN
1.0000 g | INTRAVENOUS | Status: AC
  Administered 2018-10-29 – 2018-11-03 (×6): 1 g via INTRAVENOUS
  Filled 2018-10-29: qty 10
  Filled 2018-10-29: qty 1
  Filled 2018-10-29 (×4): qty 10

## 2018-10-29 NOTE — Progress Notes (Signed)
Updated patient wife and daughter over phone, answered questions.
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
## 2018-10-29 NOTE — Progress Notes (Signed)
Call to Dr Redmond Pulling cover =ing Trauma services to inform that patient's last (4) cbg's while on the Glucostablizer have been below the targeted point of 180.  Dr Redmond Pulling stated "they can follow up on that on rounds this am and continue with Glucostablizer.
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
## 2018-10-29 NOTE — Progress Notes (Signed)
Subjective: Patient reports somnolent  Objective: Vital signs in last 24 hours: Temp:  [100.1 F (37.8 C)-103 F (39.4 C)] 102.2 F (39 C) (01/10 0700) Pulse Rate:  [110-122] 114 (01/10 0700) Resp:  [30-41] 30 (01/10 0700) BP: (124-135)/(65-80) 133/68 (01/10 0700) SpO2:  [93 %-99 %] 96 % (01/10 0700)  Intake/Output from previous day: 01/09 0701 - 01/10 0700 In: 4055.1 [I.V.:2090.1; NG/GT:1865; IV Piggyback:100] Out: 1550 [Urine:1550] Intake/Output this shift: No intake/output data recorded.  Somnolent, slow to arouse. Congested cough. Not following commands this morning.   Lab Results: Recent Labs    10/27/18 0544 10/28/18 0516  WBC 13.0* 15.2*  HGB 11.6* 12.3*  HCT 38.2* 39.1  PLT 561* 585*   BMET Recent Labs    10/28/18 0516  NA 145  K 4.7  CL 108  CO2 26  GLUCOSE 331*  BUN 50*  CREATININE 1.22  CALCIUM 9.6    Studies/Results: Dg Chest Port 1 View  Result Date: 10/28/2018 CLINICAL DATA:  Fever. EXAM: PORTABLE CHEST 1 VIEW COMPARISON:  10/26/2018. FINDINGS: Feeding tube and PowerPort catheter stable position. Cardiomegaly. No focal alveolar infiltrate. No pleural effusion or pneumothorax. IMPRESSION: 1.  Feeding tube and PowerPort catheter stable position. 2.  Stable mild bibasilar atelectasis again noted. 3.  Stable cardiomegaly. Electronically Signed   By: Marcello Moores  Register   On: 10/28/2018 09:25    Assessment/Plan:   LOS: 18 days  Supportive care continues. Given body habitus and weakness, I wonder if chest PT &/or tilt bed might help clear secretions?   PoteatAaron Edelman 10/29/2018, 8:00 AM
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
## 2018-10-29 NOTE — Progress Notes (Signed)
SLP Cancellation Note  Patient Details Name: Randy Carson MRN: [id number] DOB: Jun 15, 1961   Cancelled treatment:        Unable to wake pt this morning after his PT/OT session. Will continue efforts.   [name] 10/29/2018, 3:59 PM    [name].Ed Risk analyst [phone] Office [phone]

## 2018-10-30 LAB — CBC
HCT: 32.2 % — ABNORMAL LOW (ref 39.0–52.0)
HEMOGLOBIN: 9.7 g/dL — AB (ref 13.0–17.0)
MCH: 30 pg (ref 26.0–34.0)
MCHC: 30.1 g/dL (ref 30.0–36.0)
MCV: 99.7 fL (ref 80.0–100.0)
Platelets: 329 10*3/uL (ref 150–400)
RBC: 3.23 MIL/uL — ABNORMAL LOW (ref 4.22–5.81)
RDW: 14 % (ref 11.5–15.5)
WBC: 9.4 10*3/uL (ref 4.0–10.5)
nRBC: 0 % (ref 0.0–0.2)

## 2018-10-30 LAB — GLUCOSE, CAPILLARY
Glucose-Capillary: 175 mg/dL — ABNORMAL HIGH (ref 70–99)
Glucose-Capillary: 180 mg/dL — ABNORMAL HIGH (ref 70–99)
Glucose-Capillary: 208 mg/dL — ABNORMAL HIGH (ref 70–99)
Glucose-Capillary: 216 mg/dL — ABNORMAL HIGH (ref 70–99)
Glucose-Capillary: 227 mg/dL — ABNORMAL HIGH (ref 70–99)
Glucose-Capillary: 239 mg/dL — ABNORMAL HIGH (ref 70–99)
Glucose-Capillary: 258 mg/dL — ABNORMAL HIGH (ref 70–99)

## 2018-10-30 LAB — BASIC METABOLIC PANEL
Anion gap: 10 (ref 5–15)
BUN: 35 mg/dL — AB (ref 6–20)
CO2: 26 mmol/L (ref 22–32)
Calcium: 8.6 mg/dL — ABNORMAL LOW (ref 8.9–10.3)
Chloride: 112 mmol/L — ABNORMAL HIGH (ref 98–111)
Creatinine, Ser: 1 mg/dL (ref 0.61–1.24)
GFR calc Af Amer: >60 mL/min (ref >60)
GFR calc non Af Amer: >60 mL/min (ref >60)
Glucose, Bld: 266 mg/dL — ABNORMAL HIGH (ref 70–99)
POTASSIUM: 3.7 mmol/L (ref 3.5–5.1)
Sodium: 148 mmol/L — ABNORMAL HIGH (ref 135–145)
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
## 2018-10-31 LAB — GLUCOSE, CAPILLARY
Glucose-Capillary: 214 mg/dL — ABNORMAL HIGH (ref 70–99)
Glucose-Capillary: 180 mg/dL — ABNORMAL HIGH (ref 70–99)
Glucose-Capillary: 190 mg/dL — ABNORMAL HIGH (ref 70–99)
Glucose-Capillary: 169 mg/dL — ABNORMAL HIGH (ref 70–99)
Glucose-Capillary: 154 mg/dL — ABNORMAL HIGH (ref 70–99)
Glucose-Capillary: 155 mg/dL — ABNORMAL HIGH (ref 70–99)

## 2018-10-31 NOTE — Progress Notes (Signed)
  NEUROSURGERY PROGRESS NOTE   No issues overnight.  Grunts, otherwise nonverbal  EXAM:  BP (!) 108/49   Pulse 80   Temp 98.4 F (36.9 C) (Oral)   Resp 16   Ht 6' (1.829 m)   Wt 120 kg   SpO2 95%   BMI 35.88 kg/m   Somnolent, awakens to voice Follows commands with encouragement (shows thumb b/l, wiggles feet b/l)  PLAN No change Continue supportive care
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
## 2018-11-01 ENCOUNTER — Encounter (HOSPITAL_COMMUNITY): Payer: Self-pay | Admitting: Anesthesiology

## 2018-11-01 LAB — GLUCOSE, CAPILLARY
Glucose-Capillary: 118 mg/dL — ABNORMAL HIGH (ref 70–99)
Glucose-Capillary: 142 mg/dL — ABNORMAL HIGH (ref 70–99)
Glucose-Capillary: 169 mg/dL — ABNORMAL HIGH (ref 70–99)
Glucose-Capillary: 173 mg/dL — ABNORMAL HIGH (ref 70–99)
Glucose-Capillary: 176 mg/dL — ABNORMAL HIGH (ref 70–99)
Glucose-Capillary: 182 mg/dL — ABNORMAL HIGH (ref 70–99)

## 2018-11-01 LAB — CULTURE, BLOOD (ROUTINE X 2)
Culture: NO GROWTH
Culture: NO GROWTH

## 2018-11-01 LAB — CBC
HCT: 31.4 % — ABNORMAL LOW (ref 39.0–52.0)
Hemoglobin: 9.9 g/dL — ABNORMAL LOW (ref 13.0–17.0)
MCH: 29.8 pg (ref 26.0–34.0)
MCHC: 31.5 g/dL (ref 30.0–36.0)
MCV: 94.6 fL (ref 80.0–100.0)
Platelets: 295 10*3/uL (ref 150–400)
RBC: 3.32 MIL/uL — ABNORMAL LOW (ref 4.22–5.81)
RDW: 13.3 % (ref 11.5–15.5)
WBC: 5.7 10*3/uL (ref 4.0–10.5)
nRBC: 0 % (ref 0.0–0.2)

## 2018-11-01 LAB — BASIC METABOLIC PANEL
Anion gap: 6 (ref 5–15)
BUN: 15 mg/dL (ref 6–20)
CO2: 27 mmol/L (ref 22–32)
Calcium: 8.4 mg/dL — ABNORMAL LOW (ref 8.9–10.3)
Chloride: 106 mmol/L (ref 98–111)
Creatinine, Ser: 0.69 mg/dL (ref 0.61–1.24)
GFR calc Af Amer: >60 mL/min (ref >60)
Glucose, Bld: 165 mg/dL — ABNORMAL HIGH (ref 70–99)
Potassium: 4 mmol/L (ref 3.5–5.1)
Sodium: 139 mmol/L (ref 135–145)

## 2018-11-01 MED ORDER — INSULIN DETEMIR 100 UNIT/ML ~~LOC~~ SOLN
30.0000 [IU] | Freq: Two times a day (BID) | SUBCUTANEOUS | Status: DC
  Administered 2018-11-01 – 2018-12-02 (×56): 30 [IU] via SUBCUTANEOUS
  Filled 2018-11-01 (×64): qty 0.3
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
## 2018-11-01 NOTE — Progress Notes (Addendum)
Subjective: Patient reports "Sure" when asked to speak.  Objective: Vital signs in last 24 hours: Temp:  [98.3 F (36.8 C)-98.6 F (37 C)] 98.6 F (37 C) (01/12 2300) Pulse Rate:  [77-85] 82 (01/12 2300) Resp:  [14-20] 20 (01/12 2039) BP: (110-122)/(53-64) 112/53 (01/12 2300) SpO2:  [95 %-99 %] 97 % (01/12 2300)  Intake/Output from previous day: 01/12 0701 - 01/13 0700 In: 0  Out: 2125 [Urine:2125] Intake/Output this shift: No intake/output data recorded.  Awake, drowsy. Responds to voice, follows commands, moving all extremities. Short attention span.   Lab Results: Recent Labs    10/30/18 0630 11/01/18 0649  WBC 9.4 5.7  HGB 9.7* 9.9*  HCT 32.2* 31.4*  PLT 329 295   BMET Recent Labs    10/30/18 0630 11/01/18 0649  NA 148* 139  K 3.7 4.0  CL 112* 106  CO2 26 27  GLUCOSE 266* 165*  BUN 35* 15  CREATININE 1.00 0.69  CALCIUM 8.6* 8.4*    Studies/Results: No results found.  Assessment/Plan:   LOS: 21 days  Continue supportive care.   Verdis Prime 11/01/2018, 8:14 AM   Continue support

## 2018-11-01 NOTE — NC FL2 (Addendum)
[place] LEVEL OF CARE SCREENING TOOL     IDENTIFICATION  Patient Name: Randy Carson Birthdate: [birth year] Sex: male Admission Date (Current Location): 10/11/2018  [facility] and [place] Number:  [employer] and Address:  [facility] [place] [place] [address] [place], [place] [address]      Provider Number: [id number]  Attending Physician Name and Address:  Md, Trauma, MD  Relative Name and Phone Number:  Eliazer Hemphill; wife; [phone]    Current Level of Care: Hospital Recommended Level of Care: [place] Prior Approval Number:    Date Approved/Denied:   PASRR Number: [id number]  Discharge Plan: SNF    Current Diagnoses: Patient Active Problem List   Diagnosis Date Noted  . Pressure injury of skin 10/27/2018  . Subdural hemorrhage ([place]) 10/18/2018    Orientation RESPIRATION BLADDER Height & Weight     Self  Normal Incontinent, External catheter Weight: 264 lb 8.8 oz (120 kg) Height:  6' (182.9 cm)  BEHAVIORAL SYMPTOMS/MOOD NEUROLOGICAL BOWEL NUTRITION STATUS      Incontinent Feeding tube(PEG)  AMBULATORY STATUS COMMUNICATION OF NEEDS Skin   Extensive Assist Non-Verbally PU Stage and Appropriate Care   PU Stage 2 Dressing: (Foam Dressing PRN)                   Personal Care Assistance Level of Assistance  Bathing, Feeding, Dressing Bathing Assistance: Limited assistance Feeding assistance: Limited assistance Dressing Assistance: Limited assistance     Functional Limitations Info  Sight, Hearing, Speech Sight Info: Adequate Hearing Info: Adequate Speech Info: Impaired    SPECIAL CARE FACTORS FREQUENCY  PT (By licensed PT), OT (By licensed OT), Speech therapy     PT Frequency: 5x week OT Frequency: 5x week     Speech Therapy Frequency: 3x week      Contractures Contractures Info: Not present    Additional Factors Info  Code Status, Allergies, Psychotropic, Insulin Sliding Scale  Code Status Info: Full Code Allergies Info: Rosiglitazone, Soma Carisoprodol Psychotropic Info: Seroquel Insulin Sliding Scale Info: Novolog Sliding Scale       Current Medications (11/01/2018):  This is the current hospital active medication list Current Facility-Administered Medications  Medication Dose Route Frequency Provider Last Rate Last Dose  . 0.45 % sodium chloride infusion   Intravenous Continuous Focht, Bobi Daudelin L, PA 100 mL/hr at 11/01/18 0242    . 0.9 %  sodium chloride infusion   Intravenous PRN Erroll Luna, MD   Stopped at 10/21/18 1541  . acetaminophen (TYLENOL) suppository 650 mg  650 mg Rectal Q4H PRN Cornett, Marcello Moores, MD   650 mg at 10/27/18 1736   Or  . acetaminophen (TYLENOL) solution 650 mg  650 mg Per Tube Q4H PRN Erroll Luna, MD   650 mg at 10/30/18 0531  . cefTRIAXone (ROCEPHIN) 1 g in sodium chloride 0.9 % 100 mL IVPB  1 g Intravenous Q24H Focht, Ronald Vinsant L, PA 200 mL/hr at 11/01/18 1134 1 g at 11/01/18 1134  . chlorhexidine gluconate (MEDLINE KIT) (PERIDEX) 0.12 % solution 15 mL  15 mL Mouth Rinse BID [name], MD   15 mL at 11/01/18 0930  . Chlorhexidine Gluconate Cloth 2 % PADS 6 each  6 each Topical Q0600 Erline Levine, MD   6 each at 11/01/18 0424  . docusate (COLACE) 50 MG/5ML liquid 100 mg  100 mg Per Tube BID PRN [name], MD   100 mg at 10/18/18 1216  . enoxaparin (LOVENOX) injection  40 mg  40 mg Subcutaneous Daily [name], MD   40 mg at 11/01/18 0925  . feeding supplement (GLUCERNA 1.2 CAL) liquid 1,000 mL  1,000 mL Per Tube Continuous Focht, Varun Jourdan L, PA 70 mL/hr at 10/31/18 2340 1,000 mL at 10/31/18 2340  . feeding supplement (PRO-STAT SUGAR FREE 64) liquid 30 mL  30 mL Per Tube BID Stark Klein, MD   30 mL at 11/01/18 0925  . fentaNYL (SUBLIMAZE) injection 50 mcg  50 mcg Intravenous Q1H PRN [name], MD      . guaiFENesin (ROBITUSSIN) 100 MG/5ML solution 300 mg  15 mL Per Tube Q6H [name], MD   300 mg at  11/01/18 1134  . hydrALAZINE (APRESOLINE) injection 10 mg  10 mg Intravenous Q2H PRN [name], MD      . insulin aspart (novoLOG) injection 0-20 Units  0-20 Units Subcutaneous Q4H Focht, Mung Rinker L, PA   4 Units at 11/01/18 1231  . insulin aspart (novoLOG) injection 6 Units  6 Units Subcutaneous Q4H Focht, Geral Coker L, PA   6 Units at 11/01/18 1231  . insulin detemir (LEVEMIR) injection 30 Units  30 Units Subcutaneous BID [name], MD   30 Units at 11/01/18 0932  . ipratropium-albuterol (DUONEB) 0.5-2.5 (3) MG/3ML nebulizer solution 3 mL  3 mL Nebulization TID Erline Levine, MD   3 mL at 11/01/18 1425  . metoprolol tartrate (LOPRESSOR) 25 mg/10 mL oral suspension 25 mg  25 mg Per Tube BID Focht, Enna Warwick L, PA   25 mg at 11/01/18 6812  . multivitamin with minerals tablet 1 tablet  1 tablet Per Tube Daily Kinsinger, Arta Bruce, MD   1 tablet at 11/01/18 0925  . ondansetron (ZOFRAN-ODT) disintegrating tablet 4 mg  4 mg Oral Q6H PRN [name], MD       Or  . ondansetron [facility]) injection 4 mg  4 mg Intravenous Q6H PRN [name], MD      . pantoprazole (PROTONIX) injection 40 mg  40 mg Intravenous QHS Romana Juniper A, MD   40 mg at 10/31/18 2119  . phenol (CHLORASEPTIC) mouth spray 1 spray  1 spray Mouth/Throat PRN Greer Pickerel, MD      . sodium chloride flush (NS) 0.9 % injection 10-40 mL  10-40 mL Intracatheter PRN Erline Levine, MD   10 mL at 11/01/18 0924  . tamsulosin (FLOMAX) capsule 0.4 mg  0.4 mg Oral Daily Kinsinger, Arta Bruce, MD   0.4 mg at 11/01/18 7517     Discharge Medications: Please see discharge summary for a list of discharge medications.  Relevant Imaging Results:  Relevant Lab Results:   Additional Information SSN [id number]   Barbette Or, [place]

## 2018-11-01 NOTE — Progress Notes (Signed)
Patient ID: Randy Carson, male   DOB: [birth year], 58 y.o.   MRN: [id number] Plan PEG in Endo tomorrow. I called his wife and discussed the procedure, risks, and benefits. She agrees. Phone consent witnessed by RN.  [name], MD, MPH, FACS Trauma: [phone] General Surgery: [phone]

## 2018-11-01 NOTE — Anesthesia Preprocedure Evaluation (Deleted)
Anesthesia Evaluation    Reviewed: Allergy & Precautions, Patient's Chart, lab work & pertinent test results  Airway        Dental   Pulmonary neg pulmonary ROS,           Cardiovascular + Past MI       Neuro/Psych Depression negative neurological ROS     GI/Hepatic negative GI ROS, Neg liver ROS,   Endo/Other  diabetesHyperthyroidism   Renal/GU negative Renal ROS     Musculoskeletal negative musculoskeletal ROS (+)   Abdominal (+) + obese,   Peds  Hematology negative hematology ROS (+)   Anesthesia Other Findings   Reproductive/Obstetrics                             Anesthesia Physical Anesthesia Plan  ASA: IV  Anesthesia Plan: MAC   Post-op Pain Management:    Induction: Intravenous  PONV Risk Score and Plan: 2 and Treatment may vary due to age or medical condition  Airway Management Planned: Natural Airway and Nasal Cannula  Additional Equipment:   Intra-op Plan:   Post-operative Plan:   Informed Consent: I have reviewed the patients History and Physical, chart, labs and discussed the procedure including the risks, benefits and alternatives for the proposed anesthesia with the patient or authorized representative who has indicated his/her understanding and acceptance.   Dental advisory given  Plan Discussed with:   Anesthesia Plan Comments:         Anesthesia Quick Evaluation

## 2018-11-02 ENCOUNTER — Inpatient Hospital Stay (HOSPITAL_COMMUNITY): Payer: No Typology Code available for payment source

## 2018-11-02 ENCOUNTER — Encounter (HOSPITAL_COMMUNITY): Admission: EM | Disposition: A | Discharge status: Home Health | Payer: Self-pay | Source: Home / Self Care

## 2018-11-02 DIAGNOSIS — S069X3S Unspecified intracranial injury with loss of consciousness of 1 hour to 5 hours 59 minutes, sequela: Secondary | ICD-10-CM

## 2018-11-02 LAB — GLUCOSE, CAPILLARY
GLUCOSE-CAPILLARY: 144 mg/dL — AB (ref 70–99)
Glucose-Capillary: 108 mg/dL — ABNORMAL HIGH (ref 70–99)
Glucose-Capillary: 122 mg/dL — ABNORMAL HIGH (ref 70–99)
Glucose-Capillary: 173 mg/dL — ABNORMAL HIGH (ref 70–99)
Glucose-Capillary: 133 mg/dL — ABNORMAL HIGH (ref 70–99)

## 2018-11-02 SURGERY — ESOPHAGOGASTRODUODENOSCOPY (EGD) WITH PROPOFOL
Anesthesia: Monitor Anesthesia Care

## 2018-11-02 MED ORDER — ALBUTEROL SULFATE (2.5 MG/3ML) 0.083% IN NEBU: 2.5000 mg | INHALATION_SOLUTION | RESPIRATORY_TRACT | Status: DC | PRN

## 2018-11-02 MED ORDER — COLLAGENASE 250 UNIT/GM EX OINT
TOPICAL_OINTMENT | Freq: Every day | CUTANEOUS | Status: DC
  Administered 2018-11-02 – 2018-11-09 (×8): via TOPICAL
  Filled 2018-11-02: qty 30

## 2018-11-02 NOTE — Progress Notes (Addendum)
Subjective: Patient reports whispers "Randy Carson" in response to name request  Objective: Vital signs in last 24 hours: Temp:  [98 F (36.7 C)-99.1 F (37.3 C)] 99.1 F (37.3 C) (01/14 0321) Pulse Rate:  [75-87] 85 (01/14 0321) Resp:  [14-25] 22 (01/14 0321) BP: (91-138)/(59-69) 124/69 (01/14 0321) SpO2:  [96 %-99 %] 97 % (01/14 0321)  Intake/Output from previous day: 01/13 0701 - 01/14 0700 In: 240 [P.O.:240] Out: 3200 [Urine:3200] Intake/Output this shift: No intake/output data recorded.  Awakens to voice. Speaks when requested but not conversant. Follows commands all extremities.  Lab Results: Recent Labs    11/01/18 0649  WBC 5.7  HGB 9.9*  HCT 31.4*  PLT 295   BMET Recent Labs    11/01/18 0649  NA 139  K 4.0  CL 106  CO2 27  GLUCOSE 165*  BUN 15  CREATININE 0.69  CALCIUM 8.4*    Studies/Results: No results found.  Assessment/Plan:   LOS: 22 days  Supportive care continues. PEG planned today   Verdis Prime 11/02/2018, 7:17 AM   Patient is improving slowly.
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
## 2018-11-02 NOTE — Progress Notes (Signed)
Rehab Admissions Coordinator Note:  Patient was screened by Cleatrice Burke for appropriateness for an Inpatient Acute Rehab Consult per PT and OT change in recommendation due to increase in abilities to participation with therapies.   At this time, we are recommending Inpatient Rehab consult if family would like pt considered for admission. Please advise.  Cleatrice Burke RN MSN 11/02/2018, 11:22 AM  I can be reached at [phone].
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
## 2018-11-02 NOTE — Progress Notes (Signed)
Modified Barium Swallow Progress Note  Patient Details  Name: Teoman Giraud MRN: [id number] Date of Birth: [birth year]  Today's Date: 11/02/2018  Modified Barium Swallow completed.  Full report located under Chart Review in the Imaging Section.  Brief recommendations include the following:  Clinical Impression  Pt's alertness improving and able to sustain attention for moderately short periods of time as of today. Orally, transition is slow and intermittently holds boluses due to cognitive impairments with brain injury. Pt self fed using right hand and contained boluses in oral cavity. Timing and coordination of pharyngeal swallow is functional however arytenoid approximation to base of epiglottis is inadequate with patent area at height of swallow leading to nectar barium penetration onto vocal cords with inconsistent sensation. Unable to produce consistently effectively throat clear or cough and penetrates remain in vestibule. Vallecular residual is mild with puree and does not significantly increase with honey thick which also does not enter vestibule during study. Pt is at higher aspiration risk due to cognitive deficits and fluctuating alertness given brain injury. SLP recommends continuing NPO for remainder of afternoon/tonight, SLP trials of Dys 1 (puree), honey thick tomorrow and initiate 3 meals a day depending on alertness/safety during ST session.      Swallow Evaluation Recommendations       SLP Diet Recommendations: Dysphagia 1 (Puree) solids;Honey thick liquids(initiate with SLP only)   Liquid Administration via: Cup   Medication Administration: Crushed with puree   Supervision: Staff to assist with self feeding;Patient able to self feed;Full supervision/cueing for compensatory strategies   Compensations: Minimize environmental distractions;Slow rate;Small sips/bites;Lingual sweep for clearance of pocketing   Postural Changes: Seated upright at 90 degrees   Oral Care  Recommendations: Oral care BID   Other Recommendations: Order thickener from pharmacy    [name] 11/02/2018,2:02 PM   [name].Ed Risk analyst [phone] Office [phone]

## 2018-11-03 LAB — GLUCOSE, CAPILLARY
Glucose-Capillary: 108 mg/dL — ABNORMAL HIGH (ref 70–99)
Glucose-Capillary: 127 mg/dL — ABNORMAL HIGH (ref 70–99)
Glucose-Capillary: 134 mg/dL — ABNORMAL HIGH (ref 70–99)
Glucose-Capillary: 146 mg/dL — ABNORMAL HIGH (ref 70–99)
Glucose-Capillary: 153 mg/dL — ABNORMAL HIGH (ref 70–99)
Glucose-Capillary: 173 mg/dL — ABNORMAL HIGH (ref 70–99)
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
## 2018-11-04 LAB — GLUCOSE, CAPILLARY
GLUCOSE-CAPILLARY: 138 mg/dL — AB (ref 70–99)
Glucose-Capillary: 139 mg/dL — ABNORMAL HIGH (ref 70–99)
Glucose-Capillary: 152 mg/dL — ABNORMAL HIGH (ref 70–99)
Glucose-Capillary: 154 mg/dL — ABNORMAL HIGH (ref 70–99)
Glucose-Capillary: 156 mg/dL — ABNORMAL HIGH (ref 70–99)
Glucose-Capillary: 160 mg/dL — ABNORMAL HIGH (ref 70–99)
Glucose-Capillary: 205 mg/dL — ABNORMAL HIGH (ref 70–99)

## 2018-11-04 MED ORDER — HEPARIN SOD (PORK) LOCK FLUSH 100 UNIT/ML IV SOLN
500.0000 [IU] | INTRAVENOUS | Status: DC
  Filled 2018-11-04: qty 5

## 2018-11-04 MED ORDER — ALTEPLASE 2 MG IJ SOLR
2.0000 mg | Freq: Once | INTRAMUSCULAR | Status: AC
  Administered 2018-11-04: 2 mg

## 2018-11-04 MED ORDER — HEPARIN SOD (PORK) LOCK FLUSH 100 UNIT/ML IV SOLN
500.0000 [IU] | INTRAVENOUS | Status: DC | PRN
  Administered 2018-11-04: 500 [IU]
  Filled 2018-11-04 (×2): qty 5
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
## 2018-11-04 NOTE — Clinical Social Work Note (Signed)
Clinical Social Worker continuing to follow patient and family for the possibility of SNF placement pending approval or denial for inpatient rehab.  Patient has been faxed out, however does not have bed offers at this time.  CSW remains available for support and will assist with discharge planning.  Barbette Or, [place]
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
## 2018-11-05 LAB — GLUCOSE, CAPILLARY
GLUCOSE-CAPILLARY: 161 mg/dL — AB (ref 70–99)
Glucose-Capillary: 130 mg/dL — ABNORMAL HIGH (ref 70–99)
Glucose-Capillary: 134 mg/dL — ABNORMAL HIGH (ref 70–99)
Glucose-Capillary: 222 mg/dL — ABNORMAL HIGH (ref 70–99)
Glucose-Capillary: 150 mg/dL — ABNORMAL HIGH (ref 70–99)
Glucose-Capillary: 138 mg/dL — ABNORMAL HIGH (ref 70–99)

## 2018-11-05 MED ORDER — SODIUM CHLORIDE 0.9% FLUSH
10.0000 mL | Freq: Two times a day (BID) | INTRAVENOUS | Status: DC
  Administered 2018-11-05 – 2018-12-01 (×48): 10 mL

## 2018-11-05 MED ORDER — SODIUM CHLORIDE 0.9% FLUSH
10.0000 mL | INTRAVENOUS | Status: DC | PRN
  Administered 2018-11-14: 10 mL
  Filled 2018-11-05: qty 40

## 2018-11-05 MED ORDER — CHLORHEXIDINE GLUCONATE 0.12 % MT SOLN
15.0000 mL | Freq: Two times a day (BID) | OROMUCOSAL | Status: DC
  Administered 2018-11-05 – 2018-11-19 (×27): 15 mL via OROMUCOSAL
  Filled 2018-11-05 (×22): qty 15

## 2018-11-05 MED ORDER — ORAL CARE MOUTH RINSE
15.0000 mL | Freq: Two times a day (BID) | OROMUCOSAL | Status: DC
  Administered 2018-11-05 – 2018-11-18 (×25): 15 mL via OROMUCOSAL

## 2018-11-05 NOTE — Progress Notes (Signed)
Subjective: Patient reports "Alright"  Objective: Vital signs in last 24 hours: Temp:  [97.6 F (36.4 C)-98.4 F (36.9 C)] 98.4 F (36.9 C) (01/17 0723) Pulse Rate:  [84-95] 84 (01/17 0723) BP: (106-123)/(62-70) 110/67 (01/17 0723) SpO2:  [100 %] 100 % (01/17 0723)  Intake/Output from previous day: 01/16 0701 - 01/17 0700 In: 2843.8 [I.V.:983; NG/GT:1860.8] Out: 2775 [Urine:2775] Intake/Output this shift: No intake/output data recorded.  Awake, responding slowly. Moves extremities on command.   Lab Results: No results for input(s): WBC, HGB, HCT, PLT in the last 72 hours. BMET No results for input(s): NA, K, CL, CO2, GLUCOSE, BUN, CREATININE, CALCIUM in the last 72 hours.  Studies/Results: No results found.  Assessment/Plan:   LOS: 25 days  Supportive care continues   Verdis Prime 11/05/2018, 7:56 AM

## 2018-11-05 NOTE — Progress Notes (Signed)
Spoke with pt's wife, Leda Gauze, at length, regarding discharge planning.  She is upset, and feels family is getting mixed messages about disposition.  Pt has been evaluated for CIR, but currently not able to tolerate therapy requirements for inpatient rehab.  Wife states she is not going to send pt to a "nursing home" and prefers that the nurses not be telling family members that is where he is being dc.  She states that she has called the [place], and they now say that he IS service connected, and may be eligible for VA benefits.  As VA is closed on Monday for [place] day, I will not be able to follow up with [place] representative until Tuesday on this.  Wife states she would agree to pt going to [place] rehab center in [facility]) if pt qualifies and is accepted.  Will investigate this on Tuesday as well.    Will provide updates as available.    Reinaldo Raddle, RN, BSN  Trauma/Neuro ICU Case Manager [phone]
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
## 2018-11-05 NOTE — Progress Notes (Signed)
Patient is able to answer simple questions with yes or no answers. Patient is able to move all extremities on command. Patient's buttocks looks much better  With less reddened areas with Santyl applied.

## 2018-11-06 LAB — GLUCOSE, CAPILLARY
Glucose-Capillary: 123 mg/dL — ABNORMAL HIGH (ref 70–99)
Glucose-Capillary: 165 mg/dL — ABNORMAL HIGH (ref 70–99)
Glucose-Capillary: 134 mg/dL — ABNORMAL HIGH (ref 70–99)
Glucose-Capillary: 158 mg/dL — ABNORMAL HIGH (ref 70–99)
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
## 2018-11-07 LAB — GLUCOSE, CAPILLARY
GLUCOSE-CAPILLARY: 162 mg/dL — AB (ref 70–99)
GLUCOSE-CAPILLARY: 124 mg/dL — AB (ref 70–99)
Glucose-Capillary: 148 mg/dL — ABNORMAL HIGH (ref 70–99)
Glucose-Capillary: 151 mg/dL — ABNORMAL HIGH (ref 70–99)
Glucose-Capillary: 153 mg/dL — ABNORMAL HIGH (ref 70–99)
Glucose-Capillary: 161 mg/dL — ABNORMAL HIGH (ref 70–99)
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
## 2018-11-08 LAB — GLUCOSE, CAPILLARY
Glucose-Capillary: 126 mg/dL — ABNORMAL HIGH (ref 70–99)
Glucose-Capillary: 138 mg/dL — ABNORMAL HIGH (ref 70–99)
Glucose-Capillary: 162 mg/dL — ABNORMAL HIGH (ref 70–99)
Glucose-Capillary: 175 mg/dL — ABNORMAL HIGH (ref 70–99)
Glucose-Capillary: 177 mg/dL — ABNORMAL HIGH (ref 70–99)
Glucose-Capillary: 194 mg/dL — ABNORMAL HIGH (ref 70–99)
Glucose-Capillary: 194 mg/dL — ABNORMAL HIGH (ref 70–99)
Glucose-Capillary: 198 mg/dL — ABNORMAL HIGH (ref 70–99)

## 2018-11-08 NOTE — Progress Notes (Signed)
Pt is alert throughout the night so sleep cycle may be off. Pt has been responding to questions and talking but with delayed responses. Pt is also nodding appropriately for night staff. Pt is moving all extremities and even uses gestures.
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
## 2018-11-09 LAB — BASIC METABOLIC PANEL
ANION GAP: 8 (ref 5–15)
BUN: 15 mg/dL (ref 6–20)
CO2: 26 mmol/L (ref 22–32)
Calcium: 9.2 mg/dL (ref 8.9–10.3)
Chloride: 102 mmol/L (ref 98–111)
Creatinine, Ser: 0.62 mg/dL (ref 0.61–1.24)
GFR calc Af Amer: >60 mL/min (ref >60)
GFR calc non Af Amer: >60 mL/min (ref >60)
GLUCOSE: 142 mg/dL — AB (ref 70–99)
Potassium: 4 mmol/L (ref 3.5–5.1)
Sodium: 136 mmol/L (ref 135–145)

## 2018-11-09 LAB — GLUCOSE, CAPILLARY
GLUCOSE-CAPILLARY: 150 mg/dL — AB (ref 70–99)
Glucose-Capillary: 119 mg/dL — ABNORMAL HIGH (ref 70–99)
Glucose-Capillary: 145 mg/dL — ABNORMAL HIGH (ref 70–99)
Glucose-Capillary: 171 mg/dL — ABNORMAL HIGH (ref 70–99)
Glucose-Capillary: 152 mg/dL — ABNORMAL HIGH (ref 70–99)
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
## 2018-11-09 NOTE — Progress Notes (Signed)
Spoke with April at [facility]:  She states ALL veterans are eligible for short term [place] rehab, whether service connected or not.  Completed [place] and faxed to [phone].  Case will be reviewed tomorrow at 1pm.    Reinaldo Raddle, RN, BSN  Trauma/Neuro ICU Case Manager [phone]
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
## 2018-11-09 NOTE — Progress Notes (Signed)
Patient ID: Randy Carson, male   DOB: 11/18/1960, 58 y.o.   MRN: [id number] Plan PEG tomorrow. I called his wife and updated her and answered her questions.  [name], MD, MPH, FACS Trauma: [phone] General Surgery: [phone]

## 2018-11-10 ENCOUNTER — Inpatient Hospital Stay (HOSPITAL_COMMUNITY): Payer: No Typology Code available for payment source | Admitting: Anesthesiology

## 2018-11-10 ENCOUNTER — Encounter (HOSPITAL_COMMUNITY): Payer: Self-pay | Admitting: Anesthesiology

## 2018-11-10 ENCOUNTER — Encounter (HOSPITAL_COMMUNITY): Admission: EM | Disposition: A | Discharge status: Home Health | Payer: Self-pay | Source: Home / Self Care

## 2018-11-10 ENCOUNTER — Other Ambulatory Visit: Payer: Self-pay

## 2018-11-10 HISTORY — PX: ESOPHAGOGASTRODUODENOSCOPY (EGD) WITH PROPOFOL: SHX5813

## 2018-11-10 HISTORY — PX: PEG PLACEMENT: SHX5437

## 2018-11-10 LAB — GLUCOSE, CAPILLARY
Glucose-Capillary: 131 mg/dL — ABNORMAL HIGH (ref 70–99)
Glucose-Capillary: 90 mg/dL (ref 70–99)
Glucose-Capillary: 87 mg/dL (ref 70–99)
Glucose-Capillary: 82 mg/dL (ref 70–99)
Glucose-Capillary: 115 mg/dL — ABNORMAL HIGH (ref 70–99)
Glucose-Capillary: 139 mg/dL — ABNORMAL HIGH (ref 70–99)
Glucose-Capillary: 176 mg/dL — ABNORMAL HIGH (ref 70–99)

## 2018-11-10 SURGERY — ESOPHAGOGASTRODUODENOSCOPY (EGD) WITH PROPOFOL
Anesthesia: Monitor Anesthesia Care

## 2018-11-10 MED ORDER — PROPOFOL 10 MG/ML IV BOLUS
INTRAVENOUS | Status: DC | PRN
  Administered 2018-11-10 (×2): 20 mg via INTRAVENOUS

## 2018-11-10 MED ORDER — LACTATED RINGERS IV SOLN
INTRAVENOUS | Status: DC | PRN
  Administered 2018-11-10 (×2): via INTRAVENOUS

## 2018-11-10 MED ORDER — CHLORHEXIDINE GLUCONATE CLOTH 2 % EX PADS
6.0000 | MEDICATED_PAD | Freq: Every day | CUTANEOUS | Status: DC
  Administered 2018-11-11 – 2018-11-12 (×2): 6 via TOPICAL

## 2018-11-10 MED ORDER — CEFAZOLIN SODIUM-DEXTROSE 2-4 GM/100ML-% IV SOLN
INTRAVENOUS | Status: AC
  Filled 2018-11-10: qty 100

## 2018-11-10 MED ORDER — ONDANSETRON HCL 4 MG/2ML IJ SOLN
INTRAMUSCULAR | Status: DC | PRN
  Administered 2018-11-10: 4 mg via INTRAVENOUS

## 2018-11-10 MED ORDER — PROPOFOL 500 MG/50ML IV EMUL
INTRAVENOUS | Status: DC | PRN
  Administered 2018-11-10: 100 ug/kg/min via INTRAVENOUS

## 2018-11-10 MED ORDER — CEFAZOLIN SODIUM-DEXTROSE 2-4 GM/100ML-% IV SOLN
2.0000 g | INTRAVENOUS | Status: AC
  Administered 2018-11-10: 2 g via INTRAVENOUS

## 2018-11-10 NOTE — Progress Notes (Signed)
OT Cancellation Note  Patient Details Name: Randy Carson MRN: [id number] DOB: 1961/05/12   Cancelled Treatment:    Reason Eval/Treat Not Completed: Patient at procedure or test/ unavailable(peg placement) OT to check back at more appropriate time.   Jeri Modena 11/10/2018, 9:19 AM   Jeri Modena, OTR/L  Acute Rehabilitation Services Pager: [phone] Office: [phone] .

## 2018-11-10 NOTE — Progress Notes (Signed)
SLP Cancellation Note  Patient Details Name: Randy Carson MRN: [id number] DOB: 17-Aug-1961   Cancelled treatment:       Reason Eval/Treat Not Completed: Attempted to see patient this am with OT - unavailable due to PEG procedure.  Will continue efforts.   [name] Laurice 11/10/2018, 3:00 PM

## 2018-11-10 NOTE — Op Note (Signed)
[facility] Patient Name: Randy Carson Procedure Date : 11/10/2018 MRN: [id number] Attending MD: [name] , MD Date of Birth: [birth year] CSN: [id number] Age: 58 Admit Type: Inpatient Procedure:                Upper GI endoscopy Indications:              Place PEG because patient is unable to eat due to                            intracranial injury Providers:                [name], MD, Jackson Latino, PA-C, Carlyn Reichert, RN, Cherylynn Ridges, Technician Referring MD:              Medicines:                Propofol per Anesthesia Complications:            No immediate complications. Estimated Blood Loss:     Estimated blood loss: none. Procedure:                Pre-Anesthesia Assessment:                           - Prior to the procedure, a History and Physical                            was performed, and patient medications and                            allergies were reviewed. The patient is unable to                            give consent secondary to the patient's altered                            mental status. The risks and benefits of the                            procedure and the sedation options and risks were                            discussed with the patient's spouse. All questions                            were answered and informed consent was obtained.                            Patient identification and proposed procedure were                            verified by the physician, the nurse, the  anesthetist and the technician in the procedure                            room. Mental Status Examination: lethargic. ASA                            Grade Assessment: III - A patient with severe                            systemic disease. After reviewing the risks and                            benefits, the patient was deemed in satisfactory                            condition to undergo  the procedure. The anesthesia                            plan was to use monitored anesthesia care (MAC).                            Immediately prior to administration of medications,                            the patient was re-assessed for adequacy to receive                            sedatives. The heart rate, respiratory rate, oxygen                            saturations, blood pressure, adequacy of pulmonary                            ventilation, and response to care were monitored                            throughout the procedure. The physical status of                            the patient was re-assessed after the procedure.                           After obtaining informed consent, the endoscope was                            passed under direct vision. Throughout the                            procedure, the patient's blood pressure, pulse, and                            oxygen saturations were monitored continuously. The  GIF-H190 ([id number]) Olympus gastroscope was                            introduced through the mouth, and advanced to the                            duodenal bulb. The upper GI endoscopy was                            accomplished without difficulty. The patient                            tolerated the procedure fairly well. Scope In: Scope Out: Findings:      No gross lesions were noted in the upper third of the esophagus.      No gross lesions were noted [Site]. Placement of an externally removable       PEG with no T-fasteners was successfully completed. The external bumper       was at the 5.0 cm marking on the tube. Impression:               - No gross lesions in esophagus.                           - No gross lesions in the stomach.                           - An externally removable PEG placement was                            successfully completed.                           - No specimens collected. Recommendation:            - Return patient to hospital ward. Procedure Code(s):        --- Professional ---                           [phone], Esophagogastroduodenoscopy, flexible,                            transoral; with directed placement of percutaneous                            gastrostomy tube Diagnosis Code(s):        --- Professional ---                           R63.3, Feeding difficulties                           E95.2W4X, Unspecified intracranial injury without                            loss of consciousness, sequela  Z43.1, Encounter for attention to gastrostomy CPT copyright 2018 [employer]. All rights reserved. The codes documented in this report are preliminary and upon coder review may  be revised to meet current compliance requirements. [name], MD 11/10/2018 10:28:56 AM This report has been signed electronically. Number of Addenda: 0

## 2018-11-10 NOTE — Anesthesia Procedure Notes (Signed)
Procedure Name: MAC Date/Time: 11/10/2018 10:00 AM Performed by: Eligha Bridegroom, CRNA Pre-anesthesia Checklist: Patient identified, Emergency Drugs available, Timeout performed, Patient being monitored and Suction available Patient Re-evaluated:Patient Re-evaluated prior to induction Oxygen Delivery Method: Nasal cannula Preoxygenation: Pre-oxygenation with 100% oxygen Induction Type: IV induction
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
## 2018-11-10 NOTE — Consult Note (Signed)
[facility] Nurse wound follow up Wound type:partial thickness left buttock, MASD Measurement:0.2cm x 0.2cm 0.1cm  Wound BVQ:XIHW re-epithelial  Drainage (amount, consistency, odor) none Periwound:intact Dressing procedure/placement/frequency:Will discontinue Santyl, wound has epithelialized and sacral protective foam in place. Repositioned and showed bedside RN how to place foam folded like a taco to fit into the crease of lower back instead of binding skin together. We will not follow, but will remain available to this patient, to nursing, and the medical and/or surgical teams.  Please re-consult if we need to assist further.  Fara Olden, RN-C, WTA-C, [place] [facility]ound Treatment Associate Ostomy Care Associate
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
## 2018-11-10 NOTE — Transfer of Care (Signed)
Immediate Anesthesia Transfer of Care Note  Patient: Brittany Amirault  Procedure(s) Performed: ESOPHAGOGASTRODUODENOSCOPY (EGD) WITH PROPOFOL (N/A ) PERCUTANEOUS ENDOSCOPIC GASTROSTOMY (PEG) PLACEMENT (N/A )  Patient Location: PACU  Anesthesia Type:MAC  Level of Consciousness: awake and alert   Airway & Oxygen Therapy: Patient Spontanous Breathing and Patient connected to nasal cannula oxygen  Post-op Assessment: Report given to RN and Post -op Vital signs reviewed and stable  Post vital signs: Reviewed and stable  Last Vitals:  Vitals Value Taken Time  BP    Temp    Pulse    Resp    SpO2      Last Pain:  Vitals:   11/10/18 0908  TempSrc: Oral  PainSc: 0-No pain         Complications: No apparent anesthesia complications
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
## 2018-11-10 NOTE — Anesthesia Postprocedure Evaluation (Signed)
Anesthesia Post Note  Patient: Randy Carson  Procedure(s) Performed: ESOPHAGOGASTRODUODENOSCOPY (EGD) WITH PROPOFOL (N/A ) PERCUTANEOUS ENDOSCOPIC GASTROSTOMY (PEG) PLACEMENT (N/A )     Patient location during evaluation: Endoscopy Anesthesia Type: MAC Level of consciousness: awake and alert Pain management: pain level controlled Vital Signs Assessment: post-procedure vital signs reviewed and stable Respiratory status: spontaneous breathing, nonlabored ventilation, respiratory function stable and patient connected to nasal cannula oxygen Cardiovascular status: stable and blood pressure returned to baseline Postop Assessment: no apparent nausea or vomiting Anesthetic complications: no    Last Vitals:  Vitals:   11/10/18 1040 11/10/18 1117  BP: 136/75 120/73  Pulse: 95 92  Resp: 19   Temp:  36.6 C  SpO2: 100% 98%    Last Pain:  Vitals:   11/10/18 1040  TempSrc:   PainSc: 0-No pain                 Catalina Gravel

## 2018-11-11 LAB — GLUCOSE, CAPILLARY
Glucose-Capillary: 109 mg/dL — ABNORMAL HIGH (ref 70–99)
Glucose-Capillary: 137 mg/dL — ABNORMAL HIGH (ref 70–99)
Glucose-Capillary: 152 mg/dL — ABNORMAL HIGH (ref 70–99)
Glucose-Capillary: 160 mg/dL — ABNORMAL HIGH (ref 70–99)
Glucose-Capillary: 167 mg/dL — ABNORMAL HIGH (ref 70–99)
Glucose-Capillary: 171 mg/dL — ABNORMAL HIGH (ref 70–99)

## 2018-11-11 MED ORDER — FREE WATER
200.0000 mL | Freq: Three times a day (TID) | Status: DC
  Administered 2018-11-11 – 2018-11-19 (×23): 200 mL

## 2018-11-11 NOTE — Progress Notes (Signed)
Notified by Freddi Starr with [facility] that pt has been approved for 45 days at [facility] approved SNF facility.  Will discuss with CSW.    Reinaldo Raddle, RN, BSN  Trauma/Neuro ICU Case Manager [phone]
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
## 2018-11-12 LAB — BASIC METABOLIC PANEL
Anion gap: 9 (ref 5–15)
BUN: 13 mg/dL (ref 6–20)
CO2: 27 mmol/L (ref 22–32)
CREATININE: 0.63 mg/dL (ref 0.61–1.24)
Calcium: 9 mg/dL (ref 8.9–10.3)
Chloride: 99 mmol/L (ref 98–111)
GFR calc non Af Amer: >60 mL/min (ref >60)
Glucose, Bld: 125 mg/dL — ABNORMAL HIGH (ref 70–99)
Potassium: 3.8 mmol/L (ref 3.5–5.1)
Sodium: 135 mmol/L (ref 135–145)

## 2018-11-12 LAB — GLUCOSE, CAPILLARY
Glucose-Capillary: 135 mg/dL — ABNORMAL HIGH (ref 70–99)
Glucose-Capillary: 135 mg/dL — ABNORMAL HIGH (ref 70–99)
Glucose-Capillary: 137 mg/dL — ABNORMAL HIGH (ref 70–99)
Glucose-Capillary: 140 mg/dL — ABNORMAL HIGH (ref 70–99)
Glucose-Capillary: 158 mg/dL — ABNORMAL HIGH (ref 70–99)
Glucose-Capillary: 162 mg/dL — ABNORMAL HIGH (ref 70–99)
Glucose-Capillary: 212 mg/dL — ABNORMAL HIGH (ref 70–99)

## 2018-11-12 NOTE — Progress Notes (Signed)
Inpatient Rehabilitation Admissions Coordinator  Noted patient has been approved for [facility] SNF. We will sign off at this time.  Danne Baxter, RN, MSN Rehab Admissions Coordinator [phone] 11/12/2018 8:31 AM

## 2018-11-12 NOTE — Progress Notes (Signed)
Met with pt and wife to discuss discharge planning.  Notified wife that [place] has approved [facility] SN[facility] for 45 days; I spoke with daughter, Lonn [place] earlier, and made her aware.  They are not happy with the thought of pt going to nursing facility, but are reluctantly agreeable to considering SNF options.  CSW to fax pt information to [place] SNFs; we will wait offers.  I let pt's wife know that the other option would be home with [facility] services.  She plans to talk to pt's daughters and follow up with me on Monday.  I encouraged wife to apply for [facility] for pt, as recommended by [employer], as pt has no additional insurance, and is not [facility]-connected with [facility].  She verbalizes understanding of this.   Reinaldo Raddle, RN, BSN  Trauma/Neuro ICU Case Manager [phone]
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
## 2018-11-13 LAB — GLUCOSE, CAPILLARY
Glucose-Capillary: 154 mg/dL — ABNORMAL HIGH (ref 70–99)
Glucose-Capillary: 142 mg/dL — ABNORMAL HIGH (ref 70–99)
Glucose-Capillary: 183 mg/dL — ABNORMAL HIGH (ref 70–99)
Glucose-Capillary: 172 mg/dL — ABNORMAL HIGH (ref 70–99)
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
## 2018-11-14 LAB — GLUCOSE, CAPILLARY
Glucose-Capillary: 205 mg/dL — ABNORMAL HIGH (ref 70–99)
Glucose-Capillary: 207 mg/dL — ABNORMAL HIGH (ref 70–99)
Glucose-Capillary: 162 mg/dL — ABNORMAL HIGH (ref 70–99)
Glucose-Capillary: 177 mg/dL — ABNORMAL HIGH (ref 70–99)
Glucose-Capillary: 175 mg/dL — ABNORMAL HIGH (ref 70–99)
Glucose-Capillary: 151 mg/dL — ABNORMAL HIGH (ref 70–99)
Glucose-Capillary: 160 mg/dL — ABNORMAL HIGH (ref 70–99)

## 2018-11-14 LAB — MRSA PCR SCREENING: MRSA by PCR: NEGATIVE
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
## 2018-11-15 LAB — GLUCOSE, CAPILLARY
GLUCOSE-CAPILLARY: 185 mg/dL — AB (ref 70–99)
Glucose-Capillary: 114 mg/dL — ABNORMAL HIGH (ref 70–99)
Glucose-Capillary: 114 mg/dL — ABNORMAL HIGH (ref 70–99)
Glucose-Capillary: 133 mg/dL — ABNORMAL HIGH (ref 70–99)
Glucose-Capillary: 147 mg/dL — ABNORMAL HIGH (ref 70–99)
Glucose-Capillary: 169 mg/dL — ABNORMAL HIGH (ref 70–99)
Glucose-Capillary: 200 mg/dL — ABNORMAL HIGH (ref 70–99)
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
## 2018-11-15 NOTE — Clinical Social Work Note (Signed)
Clinical Social Worker continuing to follow patient and family for support and discharge planning needs.  CSW spoke with patient wife over the phone regarding discharge disposition.  Patient wife understands that patient has been approved for rehab in a skilled nursing facility by the [place] for 45 days.  Patient wife does not agree with patient having to accept first available [facility] bed or the options of home with home health if the bed choice is too far away.  CSW attempted to return patient wife call to further explain, however not available and no way to leave message.  CSW will contact again tomorrow to follow up.  CSW remains available for support and to facilitate patient discharge needs once medically stable and bed available.  Barbette Or, [place]
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
## 2018-11-16 ENCOUNTER — Inpatient Hospital Stay (HOSPITAL_COMMUNITY): Payer: No Typology Code available for payment source

## 2018-11-16 LAB — GLUCOSE, CAPILLARY
Glucose-Capillary: 123 mg/dL — ABNORMAL HIGH (ref 70–99)
Glucose-Capillary: 135 mg/dL — ABNORMAL HIGH (ref 70–99)
Glucose-Capillary: 168 mg/dL — ABNORMAL HIGH (ref 70–99)
Glucose-Capillary: 198 mg/dL — ABNORMAL HIGH (ref 70–99)
Glucose-Capillary: 235 mg/dL — ABNORMAL HIGH (ref 70–99)
Glucose-Capillary: 201 mg/dL — ABNORMAL HIGH (ref 70–99)

## 2018-11-16 MED ORDER — RESOURCE THICKENUP CLEAR PO POWD
ORAL | Status: DC | PRN
  Filled 2018-11-16 (×2): qty 125

## 2018-11-16 MED ORDER — PANTOPRAZOLE SODIUM 40 MG PO PACK
40.0000 mg | PACK | Freq: Every day | ORAL | Status: DC
  Administered 2018-11-16 – 2018-11-25 (×10): 40 mg
  Filled 2018-11-16 (×9): qty 20
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
## 2018-11-16 NOTE — Progress Notes (Signed)
Modified Barium Swallow Progress Note  Patient Details  Name: Randy Carson MRN: [id number] Date of Birth: [birth year]  Today's Date: 11/16/2018  Modified Barium Swallow completed.  Full report located under Chart Review in the Imaging Section.  Brief recommendations include the following:  Clinical Impression  Pt's overall ability to sustain alertness and swallow function has improved from prior MBS. Mastication with solid texture/applesauce was prolonged with delayed transit. Timing of pharyngeal swallow initiated appropriately at or slightly above level of vallecule. Intermittently reduced epiglottic deflection led to vocal cord level penetration with immediate reflexive cough of nectar. Cognitive deficits are significant and at present unreliable to perform compensatory strategies. Min-mild vallecular and pyriform sinus residue not posing negative impact on safety. Honey thick consistently navigated pharynx without entering laryngeal vestibule and did not leave significant residue. Recommend pt initiate Dys 1, honey thick, crush pills, FULL supervision/assist for initiation and sustaining attention to meals, ask pt to throat clear intermittently and ensure wakeful state prior to feeding.            Swallow Evaluation Recommendations       SLP Diet Recommendations: Dysphagia 1 (Puree) solids;Honey thick liquids   Liquid Administration via: Cup   Medication Administration: Crushed with puree   Supervision: Patient able to self feed;Staff to assist with self feeding;Full supervision/cueing for compensatory strategies   Compensations: Minimize environmental distractions;Slow rate;Small sips/bites;Lingual sweep for clearance of pocketing;Clear throat intermittently   Postural Changes: Seated upright at 90 degrees   Oral Care Recommendations: Oral care BID   Other Recommendations: Order thickener from pharmacy    [name] 11/16/2018,1:06 PM   [name] Delleker.Ed Risk analyst [phone] Office [phone]

## 2018-11-17 LAB — GLUCOSE, CAPILLARY
Glucose-Capillary: 110 mg/dL — ABNORMAL HIGH (ref 70–99)
Glucose-Capillary: 163 mg/dL — ABNORMAL HIGH (ref 70–99)
Glucose-Capillary: 159 mg/dL — ABNORMAL HIGH (ref 70–99)
Glucose-Capillary: 165 mg/dL — ABNORMAL HIGH (ref 70–99)
Glucose-Capillary: 154 mg/dL — ABNORMAL HIGH (ref 70–99)
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
## 2018-11-17 NOTE — Clinical Social Work Note (Signed)
Clinical Social Worker continuing to follow patient and family for support and discharge planning needs.  Patient wife is concerned about the location of SNF placement at discharge.  CSW has explained that patient has been approved for a [facility] certified facility, so options are limited.  CSW has pursued placement at [facility], [facility], [facility], [facility], [facility], and [facility].  All facilities have either stated no bed availability or unable to meet patient medical needs.  Patient does continue to improve daily, however not yet with significant changes to change facility admission dynamic.    CSW received contact for Clayborne Dana from the [facility] [place] has called and left a message in hopes of gaining further guidance on options to pursue.  CSW remains available for support and to facilitate patient discharge needs once bed available.  Barbette Or, [place]
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
## 2018-11-18 ENCOUNTER — Inpatient Hospital Stay (HOSPITAL_COMMUNITY): Payer: No Typology Code available for payment source

## 2018-11-18 LAB — BASIC METABOLIC PANEL
Anion gap: 9 (ref 5–15)
BUN: 17 mg/dL (ref 6–20)
CO2: 27 mmol/L (ref 22–32)
Calcium: 9.1 mg/dL (ref 8.9–10.3)
Chloride: 100 mmol/L (ref 98–111)
Creatinine, Ser: 0.73 mg/dL (ref 0.61–1.24)
GFR calc non Af Amer: >60 mL/min (ref >60)
Glucose, Bld: 129 mg/dL — ABNORMAL HIGH (ref 70–99)
Potassium: 4 mmol/L (ref 3.5–5.1)
Sodium: 136 mmol/L (ref 135–145)

## 2018-11-18 LAB — GLUCOSE, CAPILLARY
GLUCOSE-CAPILLARY: 139 mg/dL — AB (ref 70–99)
Glucose-Capillary: 109 mg/dL — ABNORMAL HIGH (ref 70–99)
Glucose-Capillary: 115 mg/dL — ABNORMAL HIGH (ref 70–99)
Glucose-Capillary: 123 mg/dL — ABNORMAL HIGH (ref 70–99)
Glucose-Capillary: 126 mg/dL — ABNORMAL HIGH (ref 70–99)
Glucose-Capillary: 131 mg/dL — ABNORMAL HIGH (ref 70–99)
Glucose-Capillary: 151 mg/dL — ABNORMAL HIGH (ref 70–99)

## 2018-11-18 LAB — CBC
HEMATOCRIT: 35.3 % — AB (ref 39.0–52.0)
Hemoglobin: 11.5 g/dL — ABNORMAL LOW (ref 13.0–17.0)
MCH: 29.6 pg (ref 26.0–34.0)
MCHC: 32.6 g/dL (ref 30.0–36.0)
MCV: 90.7 fL (ref 80.0–100.0)
Platelets: 294 10*3/uL (ref 150–400)
RBC: 3.89 MIL/uL — ABNORMAL LOW (ref 4.22–5.81)
RDW: 13.5 % (ref 11.5–15.5)
WBC: 11.8 10*3/uL — ABNORMAL HIGH (ref 4.0–10.5)
nRBC: 0 % (ref 0.0–0.2)

## 2018-11-18 MED ORDER — IOPAMIDOL (ISOVUE-300) INJECTION 61%
INTRAVENOUS | Status: AC
  Administered 2018-11-18: 40 mL via GASTROSTOMY
  Filled 2018-11-18: qty 50

## 2018-11-18 MED ORDER — PRO-STAT SUGAR FREE PO LIQD
30.0000 mL | Freq: Two times a day (BID) | ORAL | Status: DC
  Administered 2018-11-18 – 2018-12-01 (×25): 30 mL via ORAL
  Filled 2018-11-18 (×24): qty 30
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
## 2018-11-19 LAB — GLUCOSE, CAPILLARY
Glucose-Capillary: 135 mg/dL — ABNORMAL HIGH (ref 70–99)
Glucose-Capillary: 140 mg/dL — ABNORMAL HIGH (ref 70–99)
Glucose-Capillary: 183 mg/dL — ABNORMAL HIGH (ref 70–99)
Glucose-Capillary: 162 mg/dL — ABNORMAL HIGH (ref 70–99)
Glucose-Capillary: 117 mg/dL — ABNORMAL HIGH (ref 70–99)
Glucose-Capillary: 82 mg/dL (ref 70–99)

## 2018-11-19 MED ORDER — CEPHALEXIN 250 MG/5ML PO SUSR
500.0000 mg | Freq: Two times a day (BID) | ORAL | Status: AC
  Administered 2018-11-19 – 2018-11-24 (×9): 500 mg via ORAL
  Filled 2018-11-19 (×11): qty 10

## 2018-11-19 MED ORDER — BACITRACIN ZINC 500 UNIT/GM EX OINT
TOPICAL_OINTMENT | Freq: Two times a day (BID) | CUTANEOUS | Status: DC
  Administered 2018-11-19 – 2018-11-20 (×4): via TOPICAL
  Administered 2018-11-21: 1 via TOPICAL
  Administered 2018-11-21: 21:00:00 via TOPICAL
  Administered 2018-11-22: 1 via TOPICAL
  Administered 2018-11-22 – 2018-12-02 (×20): via TOPICAL
  Filled 2018-11-19: qty 28.4
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
## 2018-11-19 NOTE — NC FL2 (Signed)
[place] LEVEL OF CARE SCREENING TOOL     IDENTIFICATION  Patient Name: Randy Carson Birthdate: [birth year] Sex: male Admission Date (Current Location): 10/11/2018  [facility] and [place] Number:  [employer] and Address:  [facility] [place], [place] [address] [place], [place] [address]      Provider Number: [id number]  Attending Physician Name and Address:  Md, Trauma, MD  Relative Name and Phone Number:  Rosemary Pentecost; wife; [phone]    Current Level of Care: Hospital Recommended Level of Care: [place] Prior Approval Number:    Date Approved/Denied:   PASRR Number: [id number]  Discharge Plan: SNF    Current Diagnoses: Patient Active Problem List   Diagnosis Date Noted  . Pressure injury of skin 10/27/2018  . Subdural hemorrhage ([place]) 10/18/2018    Orientation RESPIRATION BLADDER Height & Weight     Self  Normal Incontinent, External catheter Weight: 263 lb 14.3 oz (119.7 kg) Height:  6' (182.9 cm)  BEHAVIORAL SYMPTOMS/MOOD NEUROLOGICAL BOWEL NUTRITION STATUS      Continent Diet(Dysphagia 1 with Honey Thick Liquids)  AMBULATORY STATUS COMMUNICATION OF NEEDS Skin   Extensive Assist Verbally PU Stage and Appropriate Care   PU Stage 2 Dressing: (Foam Dressing PRN)                   Personal Care Assistance Level of Assistance  Bathing, Feeding, Dressing Bathing Assistance: Limited assistance Feeding assistance: Limited assistance Dressing Assistance: Limited assistance     Functional Limitations Info  Sight, Hearing, Speech Sight Info: Adequate Hearing Info: Adequate Speech Info: Impaired    SPECIAL CARE FACTORS FREQUENCY  PT (By licensed PT), OT (By licensed OT), Speech therapy     PT Frequency: 5x week OT Frequency: 5x week     Speech Therapy Frequency: 3x week      Contractures Contractures Info: Not present    Additional Factors Info  Code Status, Allergies, Psychotropic,  Insulin Sliding Scale Code Status Info: Full Code Allergies Info: Rosiglitazone, Soma Carisoprodol Psychotropic Info: Seroquel Insulin Sliding Scale Info: Novolog Sliding Scale       Current Medications (11/19/2018):  This is the current hospital active medication list Current Facility-Administered Medications  Medication Dose Route Frequency Provider Last Rate Last Dose  . 0.9 %  sodium chloride infusion   Intravenous PRN Erroll Luna, MD   Stopped at 10/21/18 1541  . acetaminophen (TYLENOL) suppository 650 mg  650 mg Rectal Q4H PRN Erroll Luna, MD   650 mg at 10/27/18 1736   Or  . acetaminophen (TYLENOL) solution 650 mg  650 mg Per Tube Q4H PRN Erroll Luna, MD   650 mg at 11/12/18 1118  . albuterol (PROVENTIL) (2.5 MG/3ML) 0.083% nebulizer solution 2.5 mg  2.5 mg Nebulization Q4H PRN Erline Levine, MD      . bacitracin ointment   Topical BID Rayburn, Floyce Stakes, PA-C      . cephALEXin (KEFLEX) 250 MG/5ML suspension 500 mg  500 mg Oral Q12H Rayburn, Kelly A, PA-C      . chlorhexidine (PERIDEX) 0.12 % solution 15 mL  15 mL Mouth Rinse BID [name], MD   15 mL at 11/18/18 2218  . docusate (COLACE) 50 MG/5ML liquid 100 mg  100 mg Per Tube BID PRN [name], MD   100 mg at 10/18/18 1216  . enoxaparin (LOVENOX) injection 40 mg  40 mg Subcutaneous Daily [name], MD   40 mg at 11/18/18 1660  .  feeding supplement (PRO-STAT SUGAR FREE 64) liquid 30 mL  30 mL Oral BID Saverio Danker, PA-C   30 mL at 11/18/18 2204  . free water 200 mL  200 mL Per Tube Q8H [name], MD   200 mL at 11/19/18 [phone]  . guaiFENesin (ROBITUSSIN) 100 MG/5ML solution 300 mg  15 mL Per Tube Q6H [name], MD   300 mg at 11/18/18 1637  . heparin lock flush 100 unit/mL  500 Units Intracatheter Q30 days [name], MD       And  . heparin lock flush 100 unit/mL  500 Units Intracatheter PRN [name], MD   500 Units at 11/04/18 1210  . hydrALAZINE (APRESOLINE) injection 10 mg  10  mg Intravenous Q2H PRN [name], MD      . insulin aspart (novoLOG) injection 0-20 Units  0-20 Units Subcutaneous Q4H Focht, Eisha Chatterjee L, PA   3 Units at 11/19/18 0840  . insulin aspart (novoLOG) injection 6 Units  6 Units Subcutaneous Q4H Focht, Larosa Rhines L, PA   6 Units at 11/19/18 0841  . insulin detemir (LEVEMIR) injection 30 Units  30 Units Subcutaneous BID [name], MD   30 Units at 11/18/18 [phone]  . MEDLINE mouth rinse  15 mL Mouth Rinse q12n4p [name], MD   15 mL at 11/18/18 1635  . metoprolol tartrate (LOPRESSOR) 25 mg/10 mL oral suspension 25 mg  25 mg Per Tube BID Focht, Kaelan Emami L, PA   25 mg at 11/18/18 2206  . multivitamin with minerals tablet 1 tablet  1 tablet Per Tube Daily Kinsinger, Arta Bruce, MD   1 tablet at 11/17/18 0934  . ondansetron (ZOFRAN-ODT) disintegrating tablet 4 mg  4 mg Oral Q6H PRN [name], MD       Or  . ondansetron [facility]) injection 4 mg  4 mg Intravenous Q6H PRN [name], MD      . pantoprazole sodium (PROTONIX) 40 mg/20 mL oral suspension 40 mg  40 mg Per Tube QHS Judeth Horn, MD   40 mg at 11/18/18 2204  . phenol (CHLORASEPTIC) mouth spray 1 spray  1 spray Mouth/Throat PRN Greer Pickerel, MD      . RESOURCE THICKENUP CLEAR   Oral PRN [name], MD      . sodium chloride flush (NS) 0.9 % injection 10-40 mL  10-40 mL Intracatheter Q12H Erline Levine, MD   10 mL at 11/18/18 2232  . sodium chloride flush (NS) 0.9 % injection 10-40 mL  10-40 mL Intracatheter PRN Erline Levine, MD   10 mL at 11/14/18 1114  . tamsulosin (FLOMAX) capsule 0.4 mg  0.4 mg Oral Daily Kinsinger, Arta Bruce, MD   0.4 mg at 11/17/18 5809     Discharge Medications: Please see discharge summary for a list of discharge medications.  Relevant Imaging Results:  Relevant Lab Results:   Additional Information SSN [id number]   Barbette Or, [place]
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
## 2018-11-20 LAB — GLUCOSE, CAPILLARY
Glucose-Capillary: 121 mg/dL — ABNORMAL HIGH (ref 70–99)
Glucose-Capillary: 123 mg/dL — ABNORMAL HIGH (ref 70–99)
Glucose-Capillary: 147 mg/dL — ABNORMAL HIGH (ref 70–99)
Glucose-Capillary: 160 mg/dL — ABNORMAL HIGH (ref 70–99)
Glucose-Capillary: 162 mg/dL — ABNORMAL HIGH (ref 70–99)
Glucose-Capillary: 230 mg/dL — ABNORMAL HIGH (ref 70–99)

## 2018-11-20 LAB — CBC
HCT: 35.4 % — ABNORMAL LOW (ref 39.0–52.0)
HEMOGLOBIN: 11.6 g/dL — AB (ref 13.0–17.0)
MCH: 29.6 pg (ref 26.0–34.0)
MCHC: 32.8 g/dL (ref 30.0–36.0)
MCV: 90.3 fL (ref 80.0–100.0)
NRBC: 0 % (ref 0.0–0.2)
Platelets: 302 10*3/uL (ref 150–400)
RBC: 3.92 MIL/uL — ABNORMAL LOW (ref 4.22–5.81)
RDW: 13.7 % (ref 11.5–15.5)
WBC: 8.6 10*3/uL (ref 4.0–10.5)
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
## 2018-11-21 LAB — GLUCOSE, CAPILLARY
GLUCOSE-CAPILLARY: 78 mg/dL (ref 70–99)
Glucose-Capillary: 108 mg/dL — ABNORMAL HIGH (ref 70–99)
Glucose-Capillary: 112 mg/dL — ABNORMAL HIGH (ref 70–99)
Glucose-Capillary: 143 mg/dL — ABNORMAL HIGH (ref 70–99)
Glucose-Capillary: 150 mg/dL — ABNORMAL HIGH (ref 70–99)
Glucose-Capillary: 178 mg/dL — ABNORMAL HIGH (ref 70–99)
Glucose-Capillary: 69 mg/dL — ABNORMAL LOW (ref 70–99)
Glucose-Capillary: 69 mg/dL — ABNORMAL LOW (ref 70–99)
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
## 2018-11-21 NOTE — Progress Notes (Signed)
Weekend CSW attempted to reach [place], [place] for [place] at [phone]. Weekend CSW was unsuccessful in reaching admissions staff.  CSW notes other [place] Licensed [facility] Fac[facility] may not work with patient's family wishes for close placements and notes they are located in [place] [phone], [facility] [phone], and [place] [phone].   Lamonte Richer, LCSW, [place] Worker II [phone]

## 2018-11-22 LAB — GLUCOSE, CAPILLARY
Glucose-Capillary: 74 mg/dL (ref 70–99)
Glucose-Capillary: 109 mg/dL — ABNORMAL HIGH (ref 70–99)
Glucose-Capillary: 86 mg/dL (ref 70–99)
Glucose-Capillary: 147 mg/dL — ABNORMAL HIGH (ref 70–99)
Glucose-Capillary: 138 mg/dL — ABNORMAL HIGH (ref 70–99)
Glucose-Capillary: 141 mg/dL — ABNORMAL HIGH (ref 70–99)
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
## 2018-11-23 LAB — GLUCOSE, CAPILLARY
GLUCOSE-CAPILLARY: 135 mg/dL — AB (ref 70–99)
Glucose-Capillary: 123 mg/dL — ABNORMAL HIGH (ref 70–99)
Glucose-Capillary: 100 mg/dL — ABNORMAL HIGH (ref 70–99)
Glucose-Capillary: 156 mg/dL — ABNORMAL HIGH (ref 70–99)
Glucose-Capillary: 140 mg/dL — ABNORMAL HIGH (ref 70–99)
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
## 2018-11-23 NOTE — Social Work (Addendum)
2:03pm- CSW received list of SNF facilities. Also was informed of potential new partnership with [facility] under [employer] which is [facility] third party payor. Referrals and calls made to those two facilities as well- they are reviewing pt clinicals and other information with their authorization departments.   11:12am- CSW spoke with Glean Hess, [place] at [place] in [place].  She will send list of further [place] SNFs for placement to [place] email.   Glean Hess contact information is  [phone] (pager) [phone] ext [phone].  Burman Nieves states she spoke with pt wife last night and reinforced process of [facility] SN[facility] placement. She states that pt wife let her know that pt wife has started the Medicaid process. Will utilize list to continue to make [facility] SNF referrals.   Westley Hummer, MSW, [place] Work [phone]

## 2018-11-23 NOTE — Progress Notes (Signed)
Notified by Glean Hess, CSW with [facility] that pt's wife called the [facility] last evening, expressing displeasure over the [place] SNF placement process.  CSW calling to follow up to ensure that pt has been faxed out to all available facilities.  Maggie Southern's contact information given to covering [place], who will follow up with her.    [place]:  Pager:  [phone] Phone:  [phone], ext [phone] Fax:  [phone]  Reinaldo Raddle, RN, BSN  Trauma/Neuro ICU Case Manager [phone]
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
## 2018-11-24 LAB — GLUCOSE, CAPILLARY
GLUCOSE-CAPILLARY: 160 mg/dL — AB (ref 70–99)
Glucose-Capillary: 114 mg/dL — ABNORMAL HIGH (ref 70–99)
Glucose-Capillary: 119 mg/dL — ABNORMAL HIGH (ref 70–99)
Glucose-Capillary: 119 mg/dL — ABNORMAL HIGH (ref 70–99)
Glucose-Capillary: 131 mg/dL — ABNORMAL HIGH (ref 70–99)
Glucose-Capillary: 90 mg/dL (ref 70–99)
Glucose-Capillary: 95 mg/dL (ref 70–99)

## 2018-11-24 MED ORDER — GLUCERNA SHAKE PO LIQD
237.0000 mL | Freq: Two times a day (BID) | ORAL | Status: DC
  Administered 2018-11-25 – 2018-11-28 (×6): 237 mL via ORAL
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
## 2018-11-25 LAB — GLUCOSE, CAPILLARY
GLUCOSE-CAPILLARY: 77 mg/dL (ref 70–99)
Glucose-Capillary: 107 mg/dL — ABNORMAL HIGH (ref 70–99)
Glucose-Capillary: 133 mg/dL — ABNORMAL HIGH (ref 70–99)
Glucose-Capillary: 83 mg/dL (ref 70–99)
Glucose-Capillary: 83 mg/dL (ref 70–99)
Glucose-Capillary: 93 mg/dL (ref 70–99)
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
## 2018-11-25 NOTE — Progress Notes (Signed)
  Speech Language Pathology  Patient Details Name: Randy Carson MRN: [id number] DOB: Aug 06, 1961 Today's Date: 11/25/2018 Time:  -     Planning to repeat pt's MBS tomorrow for hopeful upgrade to thinner liquids. I will know a time and notify RN tomorrow am                         [name] 11/25/2018, 12:19 PM  [name].Ed Risk analyst [phone] Office [phone]

## 2018-11-26 ENCOUNTER — Inpatient Hospital Stay (HOSPITAL_COMMUNITY): Payer: No Typology Code available for payment source

## 2018-11-26 LAB — GLUCOSE, CAPILLARY
Glucose-Capillary: 71 mg/dL (ref 70–99)
Glucose-Capillary: 79 mg/dL (ref 70–99)
Glucose-Capillary: 127 mg/dL — ABNORMAL HIGH (ref 70–99)
Glucose-Capillary: 89 mg/dL (ref 70–99)
Glucose-Capillary: 88 mg/dL (ref 70–99)

## 2018-11-26 MED ORDER — INSULIN ASPART 100 UNIT/ML ~~LOC~~ SOLN
0.0000 [IU] | Freq: Three times a day (TID) | SUBCUTANEOUS | Status: DC
  Administered 2018-11-26 – 2018-11-27 (×3): 3 [IU] via SUBCUTANEOUS
  Administered 2018-11-27: 4 [IU] via SUBCUTANEOUS
  Administered 2018-11-28 – 2018-11-29 (×3): 3 [IU] via SUBCUTANEOUS
  Administered 2018-11-30: 4 [IU] via SUBCUTANEOUS
  Administered 2018-11-30 – 2018-12-01 (×2): 3 [IU] via SUBCUTANEOUS

## 2018-11-26 MED ORDER — ADULT MULTIVITAMIN W/MINERALS CH
1.0000 | ORAL_TABLET | Freq: Every day | ORAL | Status: DC
  Administered 2018-11-27 – 2018-12-02 (×6): 1 via ORAL
  Filled 2018-11-26 (×6): qty 1

## 2018-11-26 MED ORDER — DOCUSATE SODIUM 100 MG PO CAPS: 100.0000 mg | ORAL_CAPSULE | Freq: Two times a day (BID) | ORAL | Status: DC | PRN

## 2018-11-26 MED ORDER — ACETAMINOPHEN 325 MG PO TABS: 650.0000 mg | ORAL_TABLET | ORAL | Status: DC | PRN

## 2018-11-26 MED ORDER — PANTOPRAZOLE SODIUM 40 MG PO TBEC
40.0000 mg | DELAYED_RELEASE_TABLET | Freq: Every day | ORAL | Status: DC
  Administered 2018-11-26 – 2018-12-01 (×6): 40 mg via ORAL
  Filled 2018-11-26 (×6): qty 1

## 2018-11-26 MED ORDER — ACETAMINOPHEN 650 MG RE SUPP: 650.0000 mg | RECTAL | Status: DC | PRN

## 2018-11-26 MED ORDER — METOPROLOL TARTRATE 25 MG PO TABS
25.0000 mg | ORAL_TABLET | Freq: Two times a day (BID) | ORAL | Status: DC
  Administered 2018-11-26 – 2018-12-02 (×12): 25 mg via ORAL
  Filled 2018-11-26 (×12): qty 1
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
## 2018-11-26 NOTE — Progress Notes (Signed)
Noted cbg 71. Regular ginger ale thickened to honey consistency and given to patient to prevent hypoglycemia. [place] Faster
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
## 2018-11-26 NOTE — Progress Notes (Signed)
Modified Barium Swallow Progress Note  Patient Details  Name: Randy Carson MRN: [id number] Date of Birth: Nov 15, 1960  Today's Date: 11/26/2018  Modified Barium Swallow completed.  Full report located under Chart Review in the Imaging Section.  Brief recommendations include the following:  Clinical Impression  Remarkable improvements in pt's swallow function evidenced by primarily transient laryngeal penetration with thin barium. Orally, pt demonstrated increased control and manipulation across textures; mastication of regular was mildly prolonged. Independently utilized oral hold strategy prior to propulsion. Multiple and large consecutive sips thin resulted in slight flash penetration except for one episode which reached the cords (via straw) and ejected from vestibule. No appreciable pharyngeal residue nor cervical esophageal impairments. Pt's cognition has improved including awareness to situation and he is alert. Recommend upgrade to thin liquids via cup (no straws just yet), Dys 3 (mechanical soft) texture, pills whole in puree and purposeful throat clear/cough intermittently throughout meals. ST will continue to reiterate safety.       Swallow Evaluation Recommendations       SLP Diet Recommendations: Dysphagia 3 (Mech soft) solids;Thin liquid   Liquid Administration via: Cup;No straw   Medication Administration: Whole meds with puree   Supervision: Patient able to self feed;Full supervision/cueing for compensatory strategies   Compensations: Minimize environmental distractions;Slow rate;Small sips/bites;Clear throat intermittently   Postural Changes: Seated upright at 90 degrees   Oral Care Recommendations: Oral care BID        [name] 11/26/2018,10:43 AM   [name].Ed Risk analyst [phone] Office [phone]
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
## 2018-11-26 NOTE — Progress Notes (Signed)
I had a long talk with Mrs. Carchi by phone this afternoon.  We discussed how well pt is doing, and how much progress he has made.  Pt ambulated 150 feet today with RW, and 80 feet without assistive device.  She desperately wants to bring him home, and states she "will not leave his side."  She states that there are multiple family members to assist at discharge.  I have spoken with pt as well, and all he wants is to go home with his wife.  We have no bed offers for pt at this time, and it does not look promising for [facility] SN[facility] bed.  I would appreciate therapy's input on the possibility of home discharge...would be able to arrange home health and DME through the [place], but this would, of course, take some coordination.  Wife is also wondering if pt would be able to sign POA papers, as he is now mostly oriented.  Will ask physician to weigh in on this.   I spoke with physical therapist, Selena Batten, who has worked with pt this week and on multiple occasions.  She believes this plan is doable, and plans to follow up with therapy team to have daily therapy next week, including working on stairs.  (Pt has 4 stairs to get into house, 2 to get into shower.).   Trauma PA has been made aware of possible change in disposition, pending progress with therapy.  Will follow [facility] with [facility] and pt's wife on Monday.    Reinaldo Raddle, RN, BSN  Trauma/Neuro ICU Case Manager [phone]

## 2018-11-27 LAB — GLUCOSE, CAPILLARY
GLUCOSE-CAPILLARY: 130 mg/dL — AB (ref 70–99)
Glucose-Capillary: 98 mg/dL (ref 70–99)
Glucose-Capillary: 134 mg/dL — ABNORMAL HIGH (ref 70–99)
Glucose-Capillary: 171 mg/dL — ABNORMAL HIGH (ref 70–99)
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
## 2018-11-28 LAB — GLUCOSE, CAPILLARY
Glucose-Capillary: 118 mg/dL — ABNORMAL HIGH (ref 70–99)
Glucose-Capillary: 136 mg/dL — ABNORMAL HIGH (ref 70–99)
Glucose-Capillary: 100 mg/dL — ABNORMAL HIGH (ref 70–99)
Glucose-Capillary: 117 mg/dL — ABNORMAL HIGH (ref 70–99)

## 2018-11-28 NOTE — Progress Notes (Signed)
Patient's wife request that when patient is discharged that he be discharged as early in the day as possible.  Patient's wife states she is unable to drive after dark.
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
## 2018-11-29 LAB — GLUCOSE, CAPILLARY
Glucose-Capillary: 128 mg/dL — ABNORMAL HIGH (ref 70–99)
Glucose-Capillary: 111 mg/dL — ABNORMAL HIGH (ref 70–99)
Glucose-Capillary: 127 mg/dL — ABNORMAL HIGH (ref 70–99)
Glucose-Capillary: 100 mg/dL — ABNORMAL HIGH (ref 70–99)

## 2018-11-29 MED ORDER — NAPHAZOLINE-GLYCERIN 0.012-0.2 % OP SOLN: 1.0000 [drp] | Freq: Four times a day (QID) | OPHTHALMIC | Status: DC | PRN
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
## 2018-11-29 NOTE — Progress Notes (Signed)
Patient suffers from TBI/SDH and left occipital skull fracture which impairs their ability to perform daily activities like bathing, dressing, grooming and toileting in the home.  A cane, crutch or walker will not resolve  issue with performing activities of daily living. A wheelchair will allow patient to safely perform daily activities. Patient can safely propel the wheelchair in the home or has a caregiver who can provide assistance.  Accessories: elevating leg rests (ELRs), wheel locks, extensions and anti-tippers.   Wellington Hampshire, [facility] Surgery 11/29/2018, 9:39 AM Pager: [phone] Mon 7:00 am -11:30 AM Tues-Fri 7:00 am-4:30 pm Sat-Sun 7:00 am-11:30 am
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
## 2018-11-29 NOTE — Plan of Care (Signed)
Patient states wife is ready for him to come home and he is ready to go home.

## 2018-11-29 NOTE — Progress Notes (Addendum)
11/29/2018 Pt Equipment recommendations: Randy Carson is appropriate for a standard front wheel RW and standard 18x18 WC.  He will need this equipment delivered to the hospital prior to discharge as it will be needed for his safe transport home and entry and mobility into his home once he is there.  It is not appropriate at this time to have him go to an office to be fitted/measured.    Thanks,  Barbarann Ehlers. [name], PT, DPT  Acute Rehabilitation [phone] pager [phone]) [phone] office

## 2018-11-30 ENCOUNTER — Ambulatory Visit: Payer: Self-pay | Admitting: General Surgery

## 2018-11-30 LAB — GLUCOSE, CAPILLARY
Glucose-Capillary: 111 mg/dL — ABNORMAL HIGH (ref 70–99)
Glucose-Capillary: 123 mg/dL — ABNORMAL HIGH (ref 70–99)
Glucose-Capillary: 153 mg/dL — ABNORMAL HIGH (ref 70–99)
Glucose-Capillary: 154 mg/dL — ABNORMAL HIGH (ref 70–99)
Glucose-Capillary: 93 mg/dL (ref 70–99)

## 2018-11-30 MED ORDER — PRO-STAT SUGAR FREE PO LIQD: 30.0000 mL | Freq: Two times a day (BID) | ORAL | 0 refills | Status: AC

## 2018-11-30 MED ORDER — ACETAMINOPHEN 325 MG PO TABS: 650.0000 mg | ORAL_TABLET | ORAL | Status: AC | PRN

## 2018-11-30 MED ORDER — ADULT MULTIVITAMIN W/MINERALS CH: 1.0000 | ORAL_TABLET | Freq: Every day | ORAL | Status: AC

## 2018-11-30 MED ORDER — GLUCERNA SHAKE PO LIQD: 237.0000 mL | Freq: Two times a day (BID) | ORAL | 0 refills | Status: AC

## 2018-11-30 MED ORDER — DOCUSATE SODIUM 100 MG PO CAPS: 100.0000 mg | ORAL_CAPSULE | Freq: Two times a day (BID) | ORAL | 0 refills | Status: AC | PRN

## 2018-11-30 MED ORDER — TAMSULOSIN HCL 0.4 MG PO CAPS: 0.4000 mg | ORAL_CAPSULE | Freq: Every day | ORAL | 0 refills | Status: AC
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
## 2018-11-30 NOTE — Progress Notes (Signed)
Reviewed all home arrangements with pt's wife.  She is pleased and excited that pt will be coming on home on Thursday.  She plans to come in tomorrow to work with PT/OT for training between 1-4pm.  She has many questions about patient's medications he was taking PTA have been discontinued, including two antidepressants.  She would like to speak to provider tomorrow when she visits about this, as she has concerns.    Left message for assigned PT; will follow up with scheduled PT/OT in AM regarding above.    Reinaldo Raddle, RN, BSN  Trauma/Neuro ICU Case Manager [phone]

## 2018-12-01 LAB — GLUCOSE, CAPILLARY
GLUCOSE-CAPILLARY: 120 mg/dL — AB (ref 70–99)
Glucose-Capillary: 107 mg/dL — ABNORMAL HIGH (ref 70–99)
Glucose-Capillary: 112 mg/dL — ABNORMAL HIGH (ref 70–99)
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
## 2018-12-01 NOTE — Progress Notes (Addendum)
Met with pt and wife to finalize discharge arrangements.  Wife states DME is to be delivered this evening.  ST finishing up at bedside; PT/OT have visited, and wife states training went well.  Wife has questions about pt's medications; notified PA, who plans to discuss medication concerns with wife.  Wife and pt appreciative of all help given; pt excited about leaving tomorrow.  Reinaldo Raddle, RN, BSN  Trauma/Neuro ICU Case Manager [phone]
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
## 2018-12-02 ENCOUNTER — Encounter: Payer: Self-pay | Admitting: Podiatry

## 2018-12-02 LAB — GLUCOSE, CAPILLARY
Glucose-Capillary: 94 mg/dL (ref 70–99)
Glucose-Capillary: 114 mg/dL — ABNORMAL HIGH (ref 70–99)

## 2018-12-02 NOTE — Plan of Care (Signed)
  Problem: Education: Goal: Knowledge of General Education information will improve Description Including pain rating scale, medication(s)/side effects and non-pharmacologic comfort measures Outcome: Progressing   Problem: Health Behavior/Discharge Planning: Goal: Ability to manage health-related needs will improve Outcome: Progressing   Problem: Clinical Measurements: Goal: Ability to maintain clinical measurements within normal limits will improve Outcome: Progressing Goal: Will remain free from infection Outcome: Progressing Goal: Diagnostic test results will improve Outcome: Progressing Goal: Respiratory complications will improve Outcome: Progressing Goal: Cardiovascular complication will be avoided Outcome: Progressing   Problem: Activity: Goal: Risk for activity intolerance will decrease Outcome: Progressing   Problem: Nutrition: Goal: Adequate nutrition will be maintained Outcome: Progressing   Problem: Coping: Goal: Level of anxiety will decrease Outcome: Progressing   Problem: Pain Managment: Goal: General experience of comfort will improve Outcome: Progressing   Problem: Safety: Goal: Ability to remain free from injury will improve Outcome: Progressing   Problem: Skin Integrity: Goal: Risk for impaired skin integrity will decrease Outcome: Progressing   Problem: Activity: Goal: Ability to tolerate increased activity will improve Outcome: Progressing   Problem: Respiratory: Goal: Ability to maintain a clear airway and adequate ventilation will improve Outcome: Progressing   Problem: Role Relationship: Goal: Method of communication will improve Outcome: Progressing   Problem: Education: Goal: Knowledge of the prescribed therapeutic regimen Outcome: Progressing Goal: Knowledge of disease or condition will improve Outcome: Progressing   Problem: Clinical Measurements: Goal: Neurologic status will improve Outcome: Progressing   Problem: Tissue  Perfusion: Goal: Ability to maintain intracranial pressure will improve Outcome: Progressing   Problem: Respiratory: Goal: Will regain and/or maintain adequate ventilation Outcome: Progressing   Problem: Skin Integrity: Goal: Risk for impaired skin integrity will decrease Outcome: Progressing Goal: Demonstration of wound healing without infection will improve Outcome: Progressing   Problem: Psychosocial: Goal: Ability to verbalize positive feelings about self will improve Outcome: Progressing Goal: Ability to participate in self-care as condition permits will improve Outcome: Progressing Goal: Ability to identify appropriate support needs will improve Outcome: Progressing   Problem: Health Behavior/Discharge Planning: Goal: Ability to manage health-related needs will improve Outcome: Progressing   Problem: Nutritional: Goal: Risk of aspiration will decrease Outcome: Progressing Goal: Dietary intake will improve Outcome: Progressing
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
## 2018-12-02 NOTE — Discharge Planning (Signed)
Patient left upper arm midline was removed by the unit's charge nurse. RN provided extensive teaching with patient and his spouse regarding dx, medications, and g-tube. RN demonstrated to patient and spouse the correct technique with drawing up and administering insulin. RN also demonstrated proper techniques flushing and managing patient's g-tube. Patient's spouse was able to demonstrate with no issues. Currently, CM is working on setting up [facility] with DME. Once confirmation is received this has been done, plan is for patient to discharge.

## 2018-12-02 NOTE — Discharge Instructions (Signed)
Traumatic Brain Injury  Traumatic brain injury (TBI) is an injury to the brain that results from:  · A hard, direct blow to the head (closed injury).  · An object penetrating the skull and entering the brain (open injury).  Traumatic brain injury is also called a head injury or a concussion. TBI can be mild, moderate, or severe.  What are the causes?  Common causes of this condition include:  · Falls.  · Motor vehicle accidents.  · Sports injuries.  · Assaults.  What increases the risk?  You are more likely to develop this condition if you:  · Are 75 years old or older.  · Are a man.  · Play contact sports, especially football, hockey, or soccer.  · Are in the military.  · Are a victim of violence.  · Abuse drugs or alcohol.  · Have had a previous TBI.  What are the signs or symptoms?  Symptoms may vary from person to person, and may include:  · Loss of consciousness.  · Headache.  · Confusion.  · Fatigue.  · Changes in sleep.  · Dizziness.  · Mood or personality changes.  · Memory problems.  · Nausea or vomiting or both.  · Seizures.  · Clumsiness.  · Slurred speech.  · Depression and anxiety.  · Anger.  · Trouble concentrating, organizing, or making decisions.  · Inability to control emotions or actions (impulse control).  · Loss of or dulling of the senses, such as hearing, vision, and touch. This can include:  ? Blurred vision.  ? Ringing in your ears.  How is this diagnosed?  This condition may be diagnosed based on:  · Medical history and physical exam.  · Neurologic exam. This checks for brain and nervous system function, including your reflexes, memory, and coordination.  · CT scan.  Your TBI may be described as mild, moderate, or severe.  How is this treated?  Treatment depends on the severity of your brain injury and may include:  · Breathing support (mechanical ventilation).  · Blood pressure medicines.  · Pain medicines.  · Treatments to decrease the swelling in your brain.  · Brain surgery. This may  be needed to:  ? Remove a blood clot.  ? Repair bleeding.  ? Remove an object that has penetrated the brain, such as a skull fragment or a bullet.  Treatment of TBI also includes:  · Physical and mental rest.  · Careful observation.  · Medicine. You may be prescribed medicines to help with symptoms such as headaches, nausea, or difficulty sleeping.  · Physical, occupational, and speech therapy.  · Referral to a concussion clinic or rehabilitation center.  Follow these instructions at home:  Medicines  · Take over-the-counter and prescription medicines only as told by your health care provider.  · Do not take blood thinners (anticoagulants), aspirin, or other anti-inflammatory medicines such as ibuprofen or naproxen unless approved by your health care provider.  Activity  · Rest. Rest helps the brain to heal. Make sure you:  ? Get plenty of sleep. Most adults should get 7-9 hours of sleep each night.  ? Rest during the day. Take daytime naps or rest breaks when you feel tired.  · Do not do high-risk activities that could cause a second concussion, such as riding a bike or playing sports. Having another concussion before the first one has healed can be dangerous.  · Avoid a lot of visual stimulation. This includes work on the   same. Complications sometimes occur after a brain injury. Older adults with a brain injury may have a higher risk of serious complications.  Seek support from friends and family.  Keep all follow-up visits as directed by your health care provider. This is important. Contact a health care  provider if:  Your symptoms get worse or they do not improve.  You have new symptoms.  You have another injury. Get help right away if:  You have: ? Severe, persistent headaches that are not relieved by medicine. ? Weakness or numbness in any part of your body. ? Confusion. ? Slurred speech. ? Difficulty waking up. ? Nausea or persistent vomiting. ? A feeling like you are moving when you are not (vertigo). ? Seizures or you faint. ? Changes in your vision. ? Clear or bloody discharge from your nose or ears.  You cannot use your arms or legs normally. Summary  Traumatic brain injury happens when there is a hard, direct blow to the head or when an object penetrates the skull and enters the brain.  Traumatic brain injuries may be mild, moderate, or severe. Treatment depends on the severity of your injury.  Get help right away if you have a head injury and you develop seizures, confusion, vomiting, weakness in the arms or legs, slurred speech, and other symptoms.  Rest is one of the best treatments. Do not return to activity until your health care provider approves. This information is not intended to replace advice given to you by your health care provider. Make sure you discuss any questions you have with your health care provider. Document Released: 09/26/2002 Document Revised: 11/23/2017 Document Reviewed: 11/23/2017 Elsevier Interactive Patient Education  2019 [place] not drive until cleared by Neurosurgeon Would advise against hunting/shooting at this time

## 2018-12-28 ENCOUNTER — Telehealth: Payer: Self-pay | Admitting: Gastroenterology

## 2018-12-28 NOTE — Telephone Encounter (Signed)
Left a voicemail for patient to call back. Need to ask if he already had a procedure done in the past. If he did we will need to obtain a copy for review.

## 2018-12-28 NOTE — Telephone Encounter (Signed)
Spouse called back and states they do not want to schedule a Colonoscopy at this time. He has a PEG tube that is leaking. Will take care of that first.

## 2019-11-10 DIAGNOSIS — J3802 Paralysis of vocal cords and larynx, bilateral: Secondary | ICD-10-CM | POA: Diagnosis not present

## 2019-11-10 DIAGNOSIS — J300 Vasomotor rhinitis: Secondary | ICD-10-CM | POA: Diagnosis not present

## 2019-11-10 DIAGNOSIS — R061 Stridor: Secondary | ICD-10-CM | POA: Diagnosis not present

## 2019-11-10 DIAGNOSIS — Z7289 Other problems related to lifestyle: Secondary | ICD-10-CM | POA: Diagnosis not present
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
## 2019-12-12 IMAGING — DX DG CHEST 1V PORT
1 series · 1 of 1 positions shown · non-contrast
Comparison: 10/12/2018

CLINICAL DATA: Respiratory failure.

EXAM:
PORTABLE CHEST 1 VIEW

[chest ap]
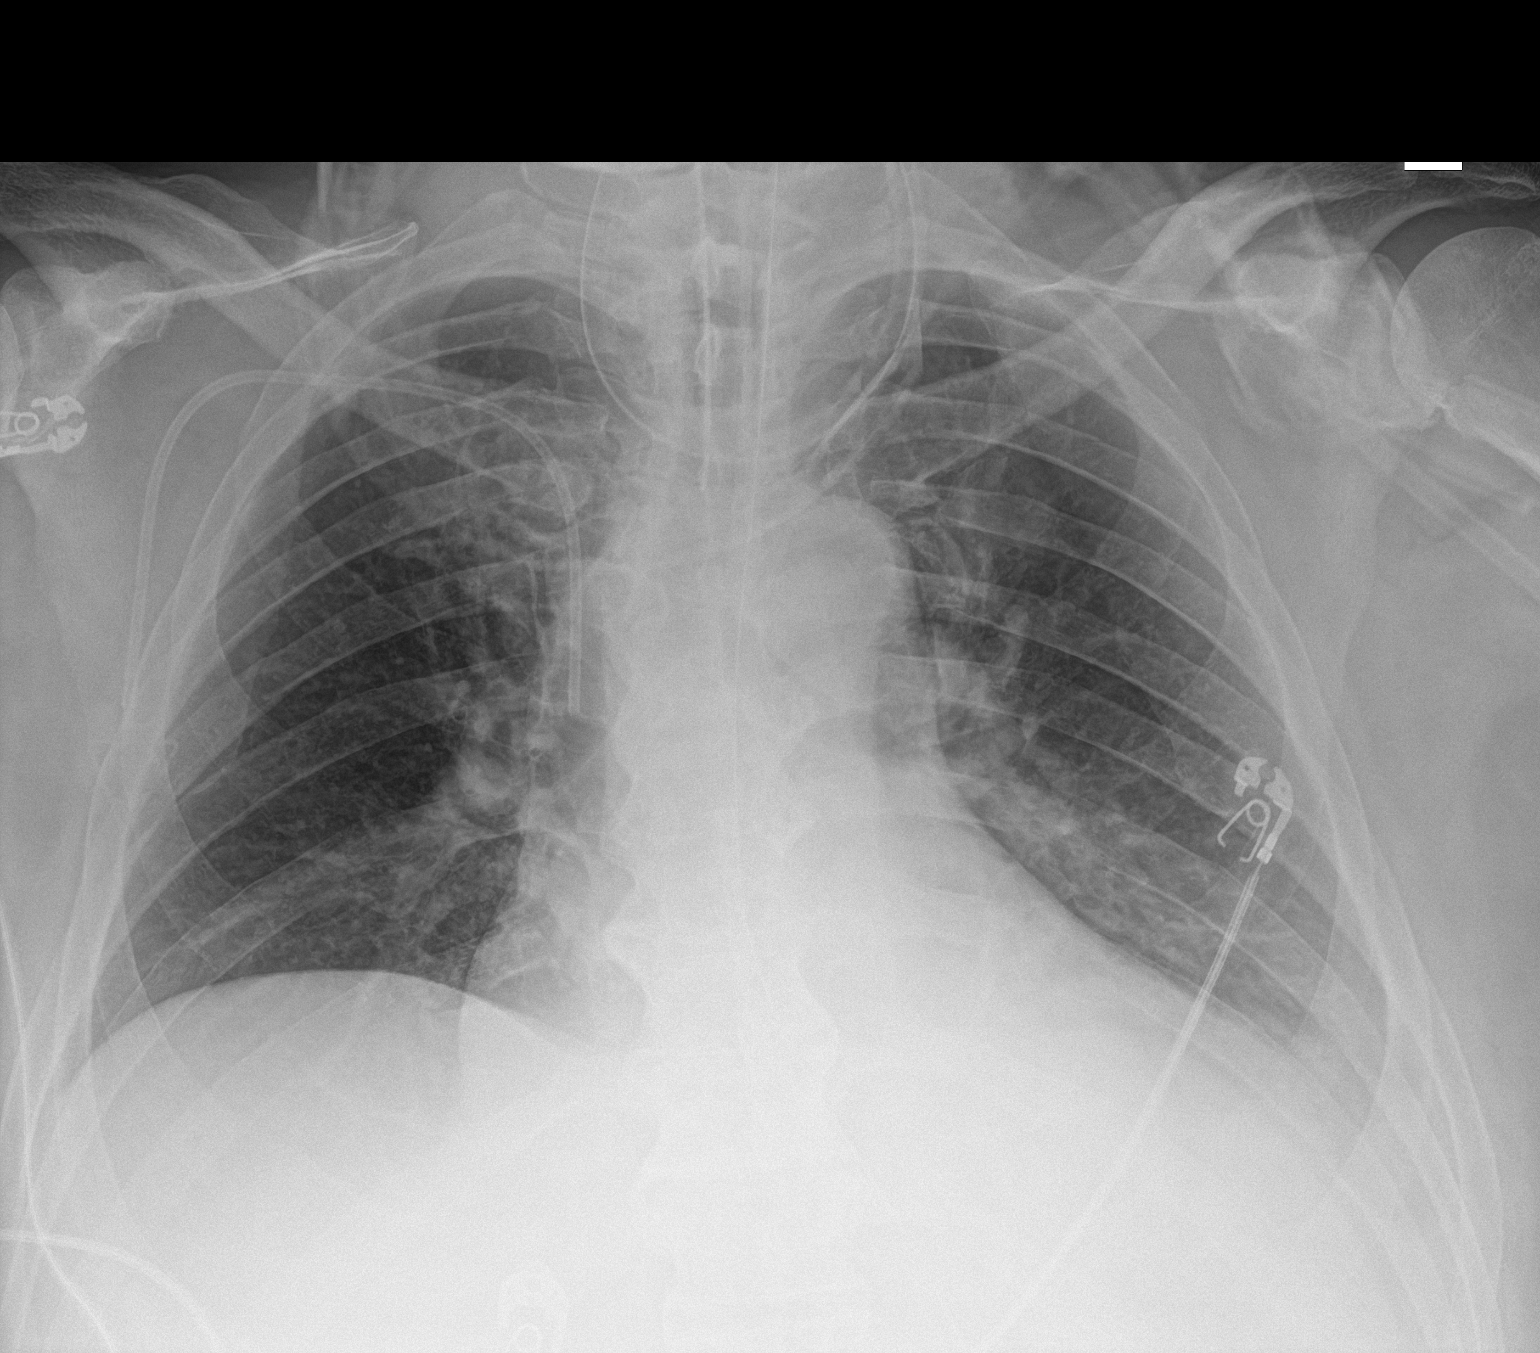

[1 of 1 positions shown; findings below may reference images not displayed]

FINDINGS: There is a right chest wall port a catheter with tip in the
projection of the SVC. The ET tube tip is above the carina. There is
an enteric tube with tip below the GE junction. Heart size normal.
Low lung volumes and left base atelectasis. No airspace opacities.
IMPRESSION: 1. Stable support apparatus.
2. Low lung volumes with atelectasis in the left base.

## 2019-12-15 IMAGING — DX DG ABD PORTABLE 1V
1 series · 1 of 1 positions shown · non-contrast
Comparison: None.

CLINICAL DATA: Orogastric tube placement.

EXAM:
PORTABLE ABDOMEN - 1 VIEW

[abdomen]
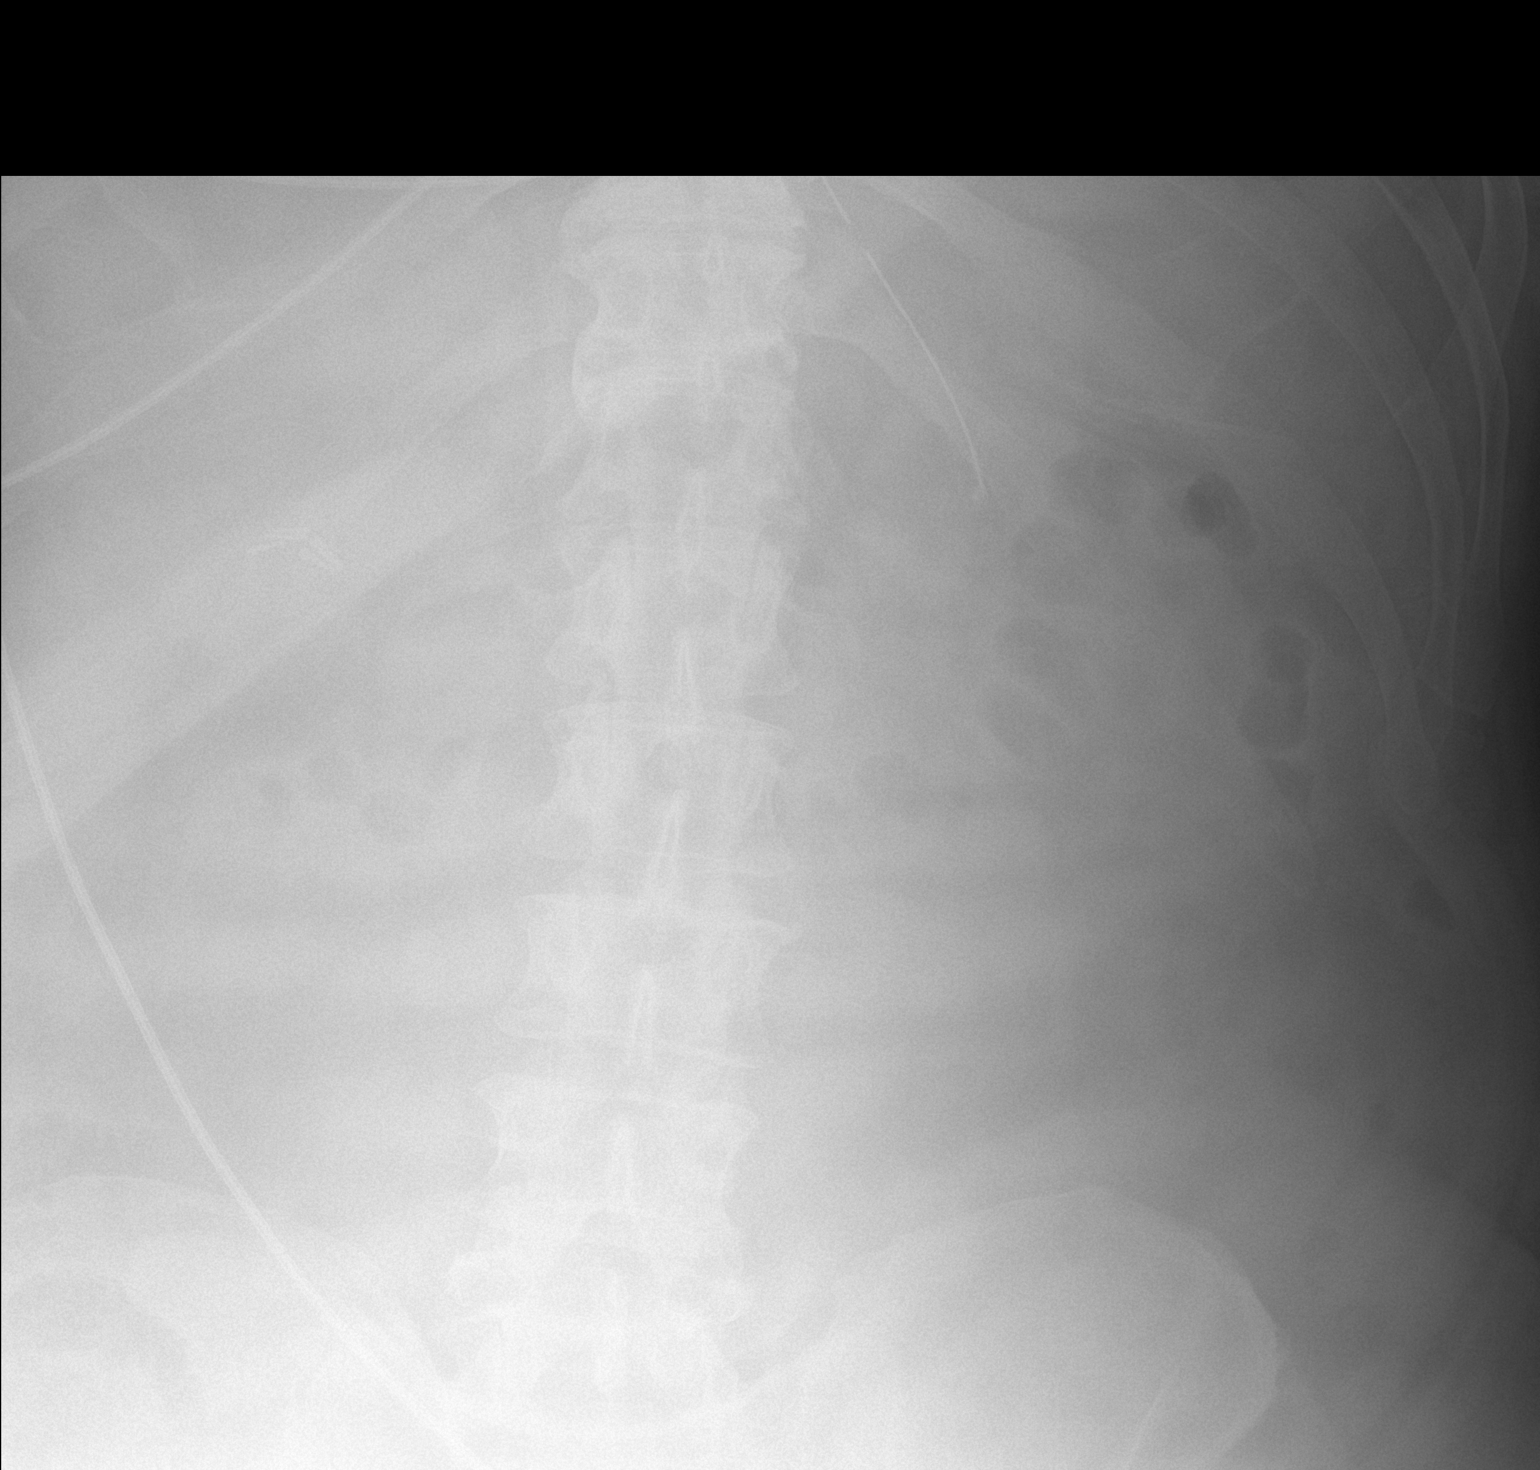

[1 of 1 positions shown; findings below may reference images not displayed]

FINDINGS: The bowel gas pattern is normal. Distal tip of orogastric tube is
seen in proximal stomach. No radio-opaque calculi or other
significant radiographic abnormality are seen.
IMPRESSION: Distal tip of orogastric tube seen in proximal stomach. No evidence
of bowel obstruction or ileus.
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
## 2019-12-24 IMAGING — DX DG CHEST 1V PORT
1 series · 1 of 1 positions shown · non-contrast
Comparison: 10/24/2018.

CLINICAL DATA: Fever.

EXAM:
PORTABLE CHEST 1 VIEW

[chest ap]
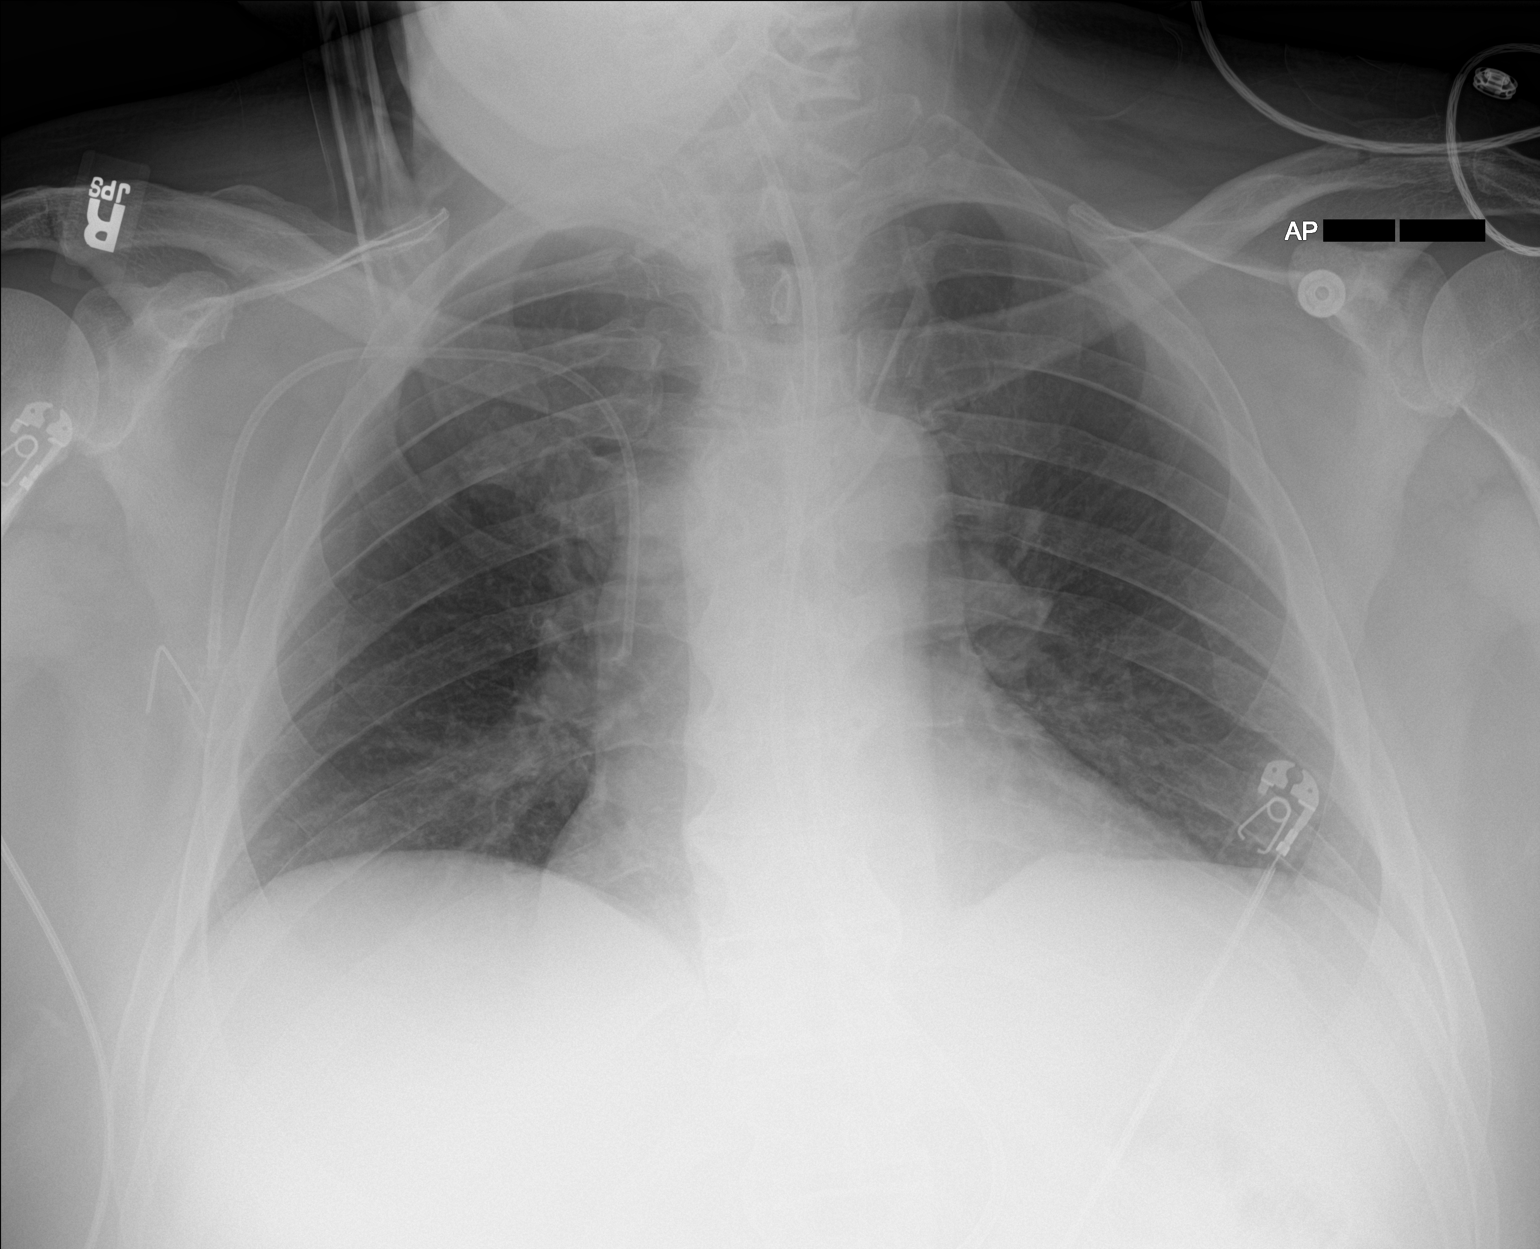

[1 of 1 positions shown; findings below may reference images not displayed]

FINDINGS: PowerPort catheter with tip over superior vena cava. Feeding tube
noted with tip below left hemidiaphragm. Heart size normal. No
pulmonary venous congestion. Mild peribronchial cuffing, bronchitis
could not be excluded. Low lung volumes with mild bibasilar
atelectasis.
IMPRESSION: 1. PowerPort catheter with tip over superior vena cava. Feeding tube
noted with tip below left hemidiaphragm.

2. Mild peribronchial cuffing. Bronchitis could not be excluded. Low
lung volumes with mild bibasilar atelectasis.

## 2020-01-10 ENCOUNTER — Emergency Department: Payer: No Typology Code available for payment source

## 2020-01-10 ENCOUNTER — Other Ambulatory Visit: Payer: Self-pay

## 2020-01-10 ENCOUNTER — Emergency Department
Admission: EM | Admit: 2020-01-10 | Discharge: 2020-01-11 | Disposition: A | Discharge status: Home / Self Care | Payer: No Typology Code available for payment source | Attending: Emergency Medicine | Admitting: Emergency Medicine

## 2020-01-10 ENCOUNTER — Encounter: Payer: Self-pay | Admitting: *Deleted

## 2020-01-10 DIAGNOSIS — R05 Cough: Secondary | ICD-10-CM | POA: Diagnosis not present

## 2020-01-10 DIAGNOSIS — E119 Type 2 diabetes mellitus without complications: Secondary | ICD-10-CM | POA: Insufficient documentation

## 2020-01-10 DIAGNOSIS — Z8572 Personal history of non-Hodgkin lymphomas: Secondary | ICD-10-CM | POA: Insufficient documentation

## 2020-01-10 DIAGNOSIS — Z794 Long term (current) use of insulin: Secondary | ICD-10-CM | POA: Diagnosis not present

## 2020-01-10 DIAGNOSIS — Z930 Tracheostomy status: Secondary | ICD-10-CM | POA: Diagnosis not present

## 2020-01-10 DIAGNOSIS — I252 Old myocardial infarction: Secondary | ICD-10-CM | POA: Diagnosis not present

## 2020-01-10 DIAGNOSIS — Z79899 Other long term (current) drug therapy: Secondary | ICD-10-CM | POA: Diagnosis not present

## 2020-01-10 DIAGNOSIS — R0602 Shortness of breath: Secondary | ICD-10-CM | POA: Diagnosis present

## 2020-01-10 DIAGNOSIS — A419 Sepsis, unspecified organism: Secondary | ICD-10-CM

## 2020-01-10 DIAGNOSIS — I10 Essential (primary) hypertension: Secondary | ICD-10-CM | POA: Insufficient documentation

## 2020-01-10 DIAGNOSIS — R509 Fever, unspecified: Secondary | ICD-10-CM | POA: Insufficient documentation

## 2020-01-10 DIAGNOSIS — R059 Cough, unspecified: Secondary | ICD-10-CM

## 2020-01-10 LAB — COMPREHENSIVE METABOLIC PANEL
ALT: 25 U/L (ref 0–44)
AST: 18 U/L (ref 15–41)
Albumin: 3.8 g/dL (ref 3.5–5.0)
Alkaline Phosphatase: 78 U/L (ref 38–126)
Anion gap: 12 (ref 5–15)
BUN: 16 mg/dL (ref 6–20)
CO2: 24 mmol/L (ref 22–32)
Calcium: 9.6 mg/dL (ref 8.9–10.3)
Chloride: 97 mmol/L — ABNORMAL LOW (ref 98–111)
Creatinine, Ser: 0.84 mg/dL (ref 0.61–1.24)
GFR calc Af Amer: >60 mL/min (ref >60)
GFR calc non Af Amer: >60 mL/min (ref >60)
Glucose, Bld: 288 mg/dL — ABNORMAL HIGH (ref 70–99)
Potassium: 4.6 mmol/L (ref 3.5–5.1)
Sodium: 133 mmol/L — ABNORMAL LOW (ref 135–145)
Total Bilirubin: 0.8 mg/dL (ref 0.3–1.2)
Total Protein: 8.8 g/dL — ABNORMAL HIGH (ref 6.5–8.1)

## 2020-01-10 LAB — CBC WITH DIFFERENTIAL/PLATELET
Abs Immature Granulocytes: 0.07 10*3/uL (ref 0.00–0.07)
Basophils Absolute: 0.1 10*3/uL (ref 0.0–0.1)
Basophils Relative: 0 %
Eosinophils Absolute: 0.1 10*3/uL (ref 0.0–0.5)
Eosinophils Relative: 1 %
HCT: 41.4 % (ref 39.0–52.0)
Hemoglobin: 13.8 g/dL (ref 13.0–17.0)
Immature Granulocytes: 1 %
Lymphocytes Relative: 13 %
Lymphs Abs: 1.6 10*3/uL (ref 0.7–4.0)
MCH: 30.2 pg (ref 26.0–34.0)
MCHC: 33.3 g/dL (ref 30.0–36.0)
MCV: 90.6 fL (ref 80.0–100.0)
Monocytes Absolute: 1.1 10*3/uL — ABNORMAL HIGH (ref 0.1–1.0)
Monocytes Relative: 9 %
Neutro Abs: 9.8 10*3/uL — ABNORMAL HIGH (ref 1.7–7.7)
Neutrophils Relative %: 76 %
Platelets: 341 10*3/uL (ref 150–400)
RBC: 4.57 MIL/uL (ref 4.22–5.81)
RDW: 12.3 % (ref 11.5–15.5)
WBC: 12.8 10*3/uL — ABNORMAL HIGH (ref 4.0–10.5)
nRBC: 0 % (ref 0.0–0.2)

## 2020-01-10 LAB — LACTIC ACID, PLASMA: Lactic Acid, Venous: 1.2 mmol/L (ref 0.5–1.9)

## 2020-01-10 MED ORDER — SODIUM CHLORIDE 0.9 % IV BOLUS
500.0000 mL | Freq: Once | INTRAVENOUS | Status: AC
  Administered 2020-01-11: 500 mL via INTRAVENOUS

## 2020-01-10 NOTE — ED Triage Notes (Signed)
Pt to ED diaphoretic and reporting SOB. Pt had a trach placed to "stretch his airway" last week and was d/c from the hospital on Friday. Pt has since become more and more SOB with thick yellow sputum draining from trach since yesterday. Fevers reported at home with last tylenol taken at 17:00. Pt reports pain when coughing and is unable to speak in complete sentences in triage.

## 2020-01-10 NOTE — ED Provider Notes (Signed)
[facility] Emergency Department Provider Note   ____________________________________________   First MD Initiated Contact with Patient 01/10/20 2308     (approximate)  I have reviewed the triage vital signs and the nursing notes.   HISTORY  Chief Complaint Shortness of Breath    HPI Randy Carson is a 59 y.o. male who presents to the ED from home with a chief complaint of shortness of breath.  On 01/02/2020 patient had tracheostomy placed at [facility] for airway stenosis, paralysis of vocal cord/larynx, bilateral dysphonia.  Tracheostomy was changed from a  6 DCT to 6 CSF on 3/19 and he was discharged home.  Reports increased secretions from his tracheostomy with thick yellow sputum and fever to 101 F at home.  Denies chest pain, abdominal pain, nausea, vomiting or diarrhea.  Denies known COVID-19 exposure.     Past Medical History:  Diagnosis Date  . Cancer [facility])    59 yrs old -"cancer brain tumor" removed  . Depression   . Diabetes ([place])   . Diabetes mellitus without complication ([place])   . Hyperlipidemia   . Hyperthyroidism   . Low testosterone    being treated with steroids  . Major depressive disorder   . Myocardial infarction ([place])    x2  . Neuropathy   . Non Hodgkin's lymphoma [facility])    "dx April 2018 - clear Oct 2018"    Patient Active Problem List   Diagnosis Date Noted  . Pressure injury of skin 10/27/2018  . Subdural hemorrhage ([place]) 10/18/2018    Past Surgical History:  Procedure Laterality Date  . BRAIN SURGERY     59 yrs old  . CHOLECYSTECTOMY  2011  . ESOPHAGOGASTRODUODENOSCOPY (EGD) WITH PROPOFOL N/A 11/10/2018   Procedure: ESOPHAGOGASTRODUODENOSCOPY (EGD) WITH PROPOFOL;  Surgeon: [name], MD;  Location: [place];  Service: Endoscopy;  Laterality: N/A;  . PEG PLACEMENT N/A 11/10/2018   Procedure: PERCUTANEOUS ENDOSCOPIC GASTROSTOMY (PEG) PLACEMENT;  Surgeon: [name], MD;   Location: [place];  Service: Endoscopy;  Laterality: N/A;    Prior to Admission medications   Medication Sig Start Date End Date Taking? Authorizing Provider  acetaminophen (TYLENOL) 325 MG tablet Take 2 tablets (650 mg total) by mouth every 4 (four) hours as needed for mild pain or fever. 11/30/18   Meuth, Brooke A, PA-C  Amino Acids-Protein Hydrolys (FEEDING SUPPLEMENT, PRO-STAT SUGAR FREE 64,) LIQD Take 30 mLs by mouth 2 (two) times daily. 11/30/18   Meuth, Brooke A, PA-C  amoxicillin-clavulanate (AUGMENTIN) 875-125 MG tablet Take 1 tablet by mouth 2 (two) times daily.    [provider]  aspirin EC 81 MG tablet Take 81 mg by mouth daily.    [provider]  chlorpheniramine-HYDROcodone (TUSSIONEX PENNKINETIC ER) 10-8 MG/5ML SUER Take 5 mLs by mouth 2 (two) times daily. 01/11/20   Paulette Blanch, MD  clindamycin (CLEOCIN) 300 MG capsule Take 1 capsule (300 mg total) by mouth 3 (three) times daily. 01/11/20   Paulette Blanch, MD  clotrimazole (LOTRIMIN) 1 % cream Apply 1 application topically 2 (two) times daily.    [provider]  docusate sodium (COLACE) 100 MG capsule Take 1 capsule (100 mg total) by mouth 2 (two) times daily as needed for mild constipation. 11/30/18   Meuth, Brooke A, PA-C  feeding supplement, GLUCERNA SHAKE, (GLUCERNA SHAKE) LIQD Take 237 mLs by mouth 2 (two) times daily between meals. 12/01/18   Meuth, Brooke A, PA-C  fluconazole (DIFLUCAN)  100 MG tablet Take 100 mg by mouth daily.    [provider]  gabapentin (NEURONTIN) 300 MG capsule Take 300 mg by mouth 3 (three) times daily. 05/14/17   [provider]  Insulin Glargine (BASAGLAR KWIKPEN) 100 UNIT/ML SOPN Inject 50 Units into the skin daily.     [provider]  insulin glargine (LANTUS) 100 UNIT/ML injection Inject 15 Units into the skin 2 (two) times daily.    [provider]  ipratropium (ATROVENT) 0.03 % nasal spray Place 1 spray into both nostrils 2 (two)  times daily as needed (runny nose).    [provider]  ketoconazole (NIZORAL) 2 % cream Apply 1 fingertip amount to affected area daily 04/26/18   Evelina Bucy, DPM  levothyroxine (SYNTHROID, LEVOTHROID) 50 MCG tablet Take 50 mcg by mouth daily.    [provider]  lisinopril (PRINIVIL,ZESTRIL) 10 MG tablet Take 10 mg by mouth daily.    [provider]  loratadine (CLARITIN) 10 MG tablet Take 10 mg by mouth daily.    [provider]  lovastatin (MEVACOR) 20 MG tablet Take 20 mg by mouth at bedtime.    [provider]  lovastatin (MEVACOR) 20 MG tablet Take 20 mg by mouth daily with supper.     [provider]  metFORMIN (GLUCOPHAGE) 1000 MG tablet Take by mouth.    [provider]  metFORMIN (GLUCOPHAGE) 1000 MG tablet Take 1,000 mg by mouth 2 (two) times daily. For diabetes (annual kidney function testing is needed)    [provider]  metoprolol tartrate (LOPRESSOR) 25 MG tablet Take 25 mg by mouth 2 (two) times daily.    [provider]  metoprolol tartrate (LOPRESSOR) 50 MG tablet Take 25 mg by mouth 2 (two) times daily. For heart    [provider]  Multiple Vitamin (MULTIVITAMIN WITH MINERALS) TABS tablet Take 1 tablet by mouth daily. 12/01/18   Meuth, Blaine Hamper, PA-C  omeprazole (PRILOSEC) 20 MG capsule Take 20 mg by mouth 2 (two) times daily before a meal.    [provider]  omeprazole (PRILOSEC) 40 MG capsule Take 40 mg by mouth 2 (two) times daily before a meal. Take on an empty stomach 30 minutes prior to a meal    [provider]  PREDNISONE PO Take by mouth.    [provider]  sertraline (ZOLOFT) 50 MG tablet Take 50 mg by mouth daily.    [provider]  tamsulosin (FLOMAX) 0.4 MG CAPS capsule Take 1 capsule (0.4 mg total) by mouth daily. 12/01/18   Meuth, Blaine Hamper, PA-C  Testosterone 20.25 MG/ACT (1.62%) GEL Place 40.5 mg onto the skin See admin instructions.  Apply 2 pumps to skin daily - apply after shower or bath to clean, dry, intact skin on upper arm or shoulder areas only.    [provider]  triamcinolone cream (KENALOG) 0.1 % Apply 1 fingertip amount twice daily to affected areas. 03/22/18   Evelina Bucy, DPM    Allergies Rosiglitazone, Rosiglitazone, and Soma [carisoprodol]  History reviewed. No pertinent family history.  Social History Social History   Tobacco Use  . Smoking status: Never Smoker  . Smokeless tobacco: Never Used  Substance Use Topics  . Alcohol use: Not Currently  . Drug use: Never    Review of Systems  Constitutional: No fever/chills Eyes: No visual changes. ENT: Positive for dislodged tracheostomy tube with yellow secretions.  No sore throat. Cardiovascular: Denies chest pain. Respiratory:  Positive for cough and shortness of breath. Gastrointestinal: No abdominal pain.  No nausea, no vomiting.  No diarrhea.  No constipation. Genitourinary: Negative for dysuria. Musculoskeletal: Negative for back pain. Skin: Negative for rash. Neurological: Negative for headaches, focal weakness or numbness.   ____________________________________________   PHYSICAL EXAM:  VITAL SIGNS: ED Triage Vitals  Enc Vitals Group     BP 01/10/20 2223 (!) 165/91     Pulse Rate 01/10/20 2223 (!) 103     Resp 01/10/20 2223 (!) 22     Temp 01/10/20 2223 99.5 F (37.5 C)     Temp Source 01/10/20 2223 Oral     SpO2 01/10/20 2223 95 %     Weight 01/10/20 2224 270 lb (122.5 kg)     Height 01/10/20 2224 6\' 2"  (1.88 m)     Head Circumference --      Peak Flow --      Pain Score 01/10/20 2224 5     Pain Loc --      Pain Edu? --      Excl. in [place]? --     Constitutional: Alert and oriented.  Chronically ill appearing and in mild acute distress. Eyes: Conjunctivae are normal. PERRL. EOMI. Head: Atraumatic. Nose: No congestion/rhinnorhea. Mouth/Throat: Mucous membranes are moist.   Neck: No stridor.  Tracheostomy  tube slightly pulled out with yellow secretions. Cardiovascular: Normal rate, regular rhythm. Grossly normal heart sounds.  Good peripheral circulation. Respiratory: Increased respiratory effort.  No retractions. Lungs with scattered rhonchi. Gastrointestinal: Soft and nontender. No distention. No abdominal bruits. No CVA tenderness. Musculoskeletal: No lower extremity tenderness nor edema.  No joint effusions. Neurologic:  Normal speech and language. No gross focal neurologic deficits are appreciated.  Skin:  Skin is warm, dry and intact. No rash noted.  No petechiae. Psychiatric: Mood and affect are normal. Speech and behavior are normal.  ____________________________________________   LABS (all labs ordered are listed, but only abnormal results are displayed)  Labs Reviewed  COMPREHENSIVE METABOLIC PANEL - Abnormal; Notable for the following components:      Result Value   Sodium 133 (*)    Chloride 97 (*)    Glucose, Bld 288 (*)    Total Protein 8.8 (*)    All other components within normal limits  CBC WITH DIFFERENTIAL/PLATELET - Abnormal; Notable for the following components:   WBC 12.8 (*)    Neutro Abs 9.8 (*)    Monocytes Absolute 1.1 (*)    All other components within normal limits  URINALYSIS, COMPLETE (UACMP) WITH MICROSCOPIC - Abnormal; Notable for the following components:   Color, Urine YELLOW (*)    APPearance HAZY (*)    Specific Gravity, Urine 1.031 (*)    Glucose, UA >=500 (*)    Protein, ur 30 (*)    All other components within normal limits  CULTURE, BLOOD (ROUTINE X 2)  CULTURE, BLOOD (ROUTINE X 2)  URINE CULTURE  LACTIC ACID, PLASMA   ____________________________________________  EKG  None ____________________________________________  RADIOLOGY  ED MD interpretation: No acute cardiopulmonary process  Official radiology report(s): CT Soft Tissue Neck W Contrast  Result Date: 01/11/2020 CLINICAL DATA:  Initial evaluation for shortness of  breath, fever, recent tracheostomy. EXAM: CT NECK WITH CONTRAST TECHNIQUE: Multidetector CT imaging of the neck was performed using the standard protocol following the bolus administration of intravenous contrast. CONTRAST:  87mL OMNIPAQUE IOHEXOL 300 MG/ML  SOLN COMPARISON:  None available. FINDINGS: Pharynx and larynx: Oral cavity within normal limits without  discrete mass or collection. Scattered dental caries noted about the remaining dentition without associated inflammatory changes. Oropharynx and nasopharynx within normal limits. Parapharyngeal fat maintained. No retropharyngeal collection. Epiglottis within normal limits. Vallecula clear. Remainder of the hypopharynx and supraglottic larynx within normal limits. True cords are somewhat opposed with narrowing of the glottis. Trachea place within the subglottic airway. Some dehiscence of the soft tissues about the tracheostomy tract noted (series 2, image 94). Mild hazy stranding within this region felt to be most consistent with postprocedural changes. No loculated collection or significant inflammatory changes identified about the tracheostomy. Tracheostomy itself is clear. Small amount of layering secretions noted within the subglottic trachea inferiorly. Salivary glands: Diffuse fatty infiltration of the parotid glands noted bilaterally. Submandibular glands within normal limits. Thyroid: Thyroid within normal limits. Lymph nodes: No pathologically enlarged lymph nodes identified within the neck. Shotty subcentimeter nodes noted within the upper mediastinum with mild hazy stranding, which could be reactive. Vascular: Normal intravascular enhancement seen throughout the neck. Atherosclerotic change noted about the carotid bifurcations without high-grade stenosis. Limited intracranial: Unremarkable. Visualized orbits: Visualized globes and orbital soft tissues within normal limits. Mastoids and visualized paranasal sinuses: Mild mucosal thickening noted  within the hypoplastic right maxillary sinus. Visualized paranasal sinuses are otherwise clear. Visualized mastoid air cells and middle ear cavities are well pneumatized and free of fluid. Skeleton: No acute osseous abnormality. No discrete or worrisome osseous lesions. Mild cervical spondylosis noted at C5-6. Upper chest: Partially visualized upper chest demonstrates no acute finding. Partially visualized lungs are clear. Other: None. IMPRESSION: 1. Sequelae of recent tracheostomy placement. Mild dehiscence of the soft tissues about the tracheostomy tract without significant inflammatory changes, collection, or other complication. 2. Shotty subcentimeter nodes with hazy stranding within the upper mediastinum, likely reactive from recent tracheostomy. 3. No other acute abnormality within the neck. 4. Innumerable scattered dental caries without associated inflammatory changes. Electronically Signed   By: Jeannine Boga M.D.   On: 01/11/2020 01:18   DG Chest Port 1 View  Result Date: 01/10/2020 CLINICAL DATA:  Sepsis EXAM: PORTABLE CHEST 1 VIEW COMPARISON:  10/28/2018 FINDINGS: Tracheostomy in place with the tip over the mid trachea. Heart and mediastinal contours are within normal limits. No focal opacities or effusions. No acute bony abnormality. IMPRESSION: No active cardiopulmonary disease. Electronically Signed   By: Rolm Baptise M.D.   On: 01/10/2020 23:07    ____________________________________________   PROCEDURES  Procedure(s) performed (including Critical Care):  Procedures   ____________________________________________   INITIAL IMPRESSION / ASSESSMENT AND PLAN / ED COURSE  As part of my medical decision making, I reviewed the following data within the [place] History obtained from family, Nursing notes reviewed and incorporated, Labs reviewed, Old chart reviewed, Radiograph reviewed and Notes from prior ED visits     Randy Carson was evaluated in  Emergency Department on 01/11/2020 for the symptoms described in the history of present illness. He was evaluated in the context of the global COVID-19 pandemic, which necessitated consideration that the patient might be at risk for infection with the SARS-CoV-2 virus that causes COVID-19. Institutional protocols and algorithms that pertain to the evaluation of patients at risk for COVID-19 are in a state of rapid change based on information released by regulatory bodies including the CDC and federal and state organizations. These policies and algorithms were followed during the patient's care in the ED.    59 year old male with recent tracheostomy placement who presents with fever, increased secretions and cough.  Differential diagnosis includes but is not limited to sepsis, community-acquired pneumonia, postoperative atelectasis, postoperative infection, etc.  RT at bedside to suction and reposition trach.  Lactic acid is negative.  Mild leukocytosis noted.  Chest x-ray is negative for pneumonia.  Will obtain CT neck to evaluate for postoperative abscess.  Tylenol given for fever.  Tussionex given for cough.   Clinical Course as of Jan 10 510  Wed Jan 11, 2020  0138 Updated patient and his daughter on CT imaging results and urinalysis.  He is feeling much better after RT suctioned him and repositioned his trach.  Would cover him empirically with clindamycin given fever and increased secretions.  He will follow-up with his surgeon soon.  Strict return precautions given.  Both verbalized understanding and agree with plan of care.   [JS]    Clinical Course User Index [JS] Paulette Blanch, MD     ____________________________________________   FINAL CLINICAL IMPRESSION(S) / ED DIAGNOSES  Final diagnoses:  Fever, unspecified fever cause  Cough     ED Discharge Orders         Ordered    clindamycin (CLEOCIN) 300 MG capsule  3 times daily     01/11/20 0141    chlorpheniramine-HYDROcodone  (TUSSIONEX PENNKINETIC ER) 10-8 MG/5ML SUER  2 times daily     01/11/20 0141           Note:  This document was prepared using Dragon voice recognition software and may include unintentional dictation errors.   Paulette Blanch, MD 01/11/20 [phone]

## 2020-01-11 ENCOUNTER — Encounter: Payer: Self-pay | Admitting: Radiology

## 2020-01-11 ENCOUNTER — Emergency Department: Payer: No Typology Code available for payment source

## 2020-01-11 LAB — URINALYSIS, COMPLETE (UACMP) WITH MICROSCOPIC
Bacteria, UA: NONE SEEN
Bilirubin Urine: NEGATIVE
Glucose, UA: >=500 mg/dL — AB
Hgb urine dipstick: NEGATIVE
Ketones, ur: NEGATIVE mg/dL
Leukocytes,Ua: NEGATIVE
Nitrite: NEGATIVE
Protein, ur: 30 mg/dL — AB
Specific Gravity, Urine: 1.031 — ABNORMAL HIGH (ref 1.005–1.030)
pH: 5 (ref 5.0–8.0)

## 2020-01-11 LAB — URINE CULTURE: Culture: <10000 — AB

## 2020-01-11 MED ORDER — CLINDAMYCIN HCL 150 MG PO CAPS
300.0000 mg | ORAL_CAPSULE | Freq: Once | ORAL | Status: AC
  Administered 2020-01-11: 300 mg via ORAL
  Filled 2020-01-11: qty 2

## 2020-01-11 MED ORDER — HYDROCOD POLST-CPM POLST ER 10-8 MG/5ML PO SUER: 5.0000 mL | Freq: Two times a day (BID) | ORAL | 0 refills | Status: AC

## 2020-01-11 MED ORDER — IOHEXOL 300 MG/ML  SOLN
75.0000 mL | Freq: Once | INTRAMUSCULAR | Status: AC | PRN
  Administered 2020-01-11: 75 mL via INTRAVENOUS

## 2020-01-11 MED ORDER — HYDROCOD POLST-CPM POLST ER 10-8 MG/5ML PO SUER
5.0000 mL | Freq: Once | ORAL | Status: AC
  Administered 2020-01-11: 5 mL via ORAL
  Filled 2020-01-11: qty 5

## 2020-01-11 MED ORDER — CLINDAMYCIN HCL 300 MG PO CAPS: 300.0000 mg | ORAL_CAPSULE | Freq: Three times a day (TID) | ORAL | 0 refills | Status: AC

## 2020-01-11 MED ORDER — ACETAMINOPHEN 500 MG PO TABS
1000.0000 mg | ORAL_TABLET | Freq: Once | ORAL | Status: AC
  Administered 2020-01-11: 1000 mg via ORAL
  Filled 2020-01-11: qty 2

## 2020-01-11 NOTE — ED Notes (Signed)
Pt taken to CT.

## 2020-01-11 NOTE — Discharge Instructions (Addendum)
1.  Alternate Tylenol and Ibuprofen every 4 hours as needed for fever greater than 100.4 F. 2.  Take antibiotic as prescribed (Clindamycin 300 mg 3 times daily x10 days). 3.  You may take Tussionex as needed for cough. 4.  Blood and urine cultures are pending.  You will be notified with any positive results. 5.  Return to the ER for worsening symptoms, persistent vomiting, difficulty breathing or other concerns.

## 2020-01-15 LAB — CULTURE, BLOOD (ROUTINE X 2)
Culture: NO GROWTH
Special Requests: ADEQUATE

## 2020-01-16 LAB — CULTURE, BLOOD (ROUTINE X 2)
Culture: NO GROWTH
Special Requests: ADEQUATE

## 2020-04-26 DIAGNOSIS — R69 Illness, unspecified: Secondary | ICD-10-CM | POA: Diagnosis not present

## 2020-06-21 DIAGNOSIS — Z209 Contact with and (suspected) exposure to unspecified communicable disease: Secondary | ICD-10-CM | POA: Diagnosis not present

## 2020-06-21 DIAGNOSIS — R0902 Hypoxemia: Secondary | ICD-10-CM | POA: Diagnosis not present

## 2020-06-21 DIAGNOSIS — R069 Unspecified abnormalities of breathing: Secondary | ICD-10-CM | POA: Diagnosis not present

## 2020-09-14 DIAGNOSIS — J3489 Other specified disorders of nose and nasal sinuses: Secondary | ICD-10-CM | POA: Diagnosis not present

## 2020-09-14 DIAGNOSIS — E119 Type 2 diabetes mellitus without complications: Secondary | ICD-10-CM | POA: Diagnosis not present

## 2020-09-14 DIAGNOSIS — R051 Acute cough: Secondary | ICD-10-CM | POA: Diagnosis not present

## 2020-09-14 DIAGNOSIS — Z20828 Contact with and (suspected) exposure to other viral communicable diseases: Secondary | ICD-10-CM | POA: Diagnosis not present

## 2020-11-02 DIAGNOSIS — S0191XA Laceration without foreign body of unspecified part of head, initial encounter: Secondary | ICD-10-CM | POA: Diagnosis not present

## 2020-11-02 DIAGNOSIS — S0101XA Laceration without foreign body of scalp, initial encounter: Secondary | ICD-10-CM | POA: Diagnosis not present

## 2021-06-27 DIAGNOSIS — Z8616 Personal history of COVID-19: Secondary | ICD-10-CM | POA: Diagnosis not present

## 2021-06-27 DIAGNOSIS — R0602 Shortness of breath: Secondary | ICD-10-CM | POA: Diagnosis not present

## 2021-11-07 DIAGNOSIS — Z0100 Encounter for examination of eyes and vision without abnormal findings: Secondary | ICD-10-CM | POA: Diagnosis not present

## 2021-11-07 DIAGNOSIS — Z794 Long term (current) use of insulin: Secondary | ICD-10-CM | POA: Diagnosis not present

## 2021-11-07 DIAGNOSIS — H353131 Nonexudative age-related macular degeneration, bilateral, early dry stage: Secondary | ICD-10-CM | POA: Diagnosis not present

## 2021-11-07 DIAGNOSIS — I10 Essential (primary) hypertension: Secondary | ICD-10-CM | POA: Diagnosis not present

## 2021-11-07 DIAGNOSIS — H52223 Regular astigmatism, bilateral: Secondary | ICD-10-CM | POA: Diagnosis not present

## 2021-11-07 DIAGNOSIS — E119 Type 2 diabetes mellitus without complications: Secondary | ICD-10-CM | POA: Diagnosis not present

## 2021-11-07 DIAGNOSIS — Z7984 Long term (current) use of oral hypoglycemic drugs: Secondary | ICD-10-CM | POA: Diagnosis not present

## 2021-11-07 DIAGNOSIS — H5201 Hypermetropia, right eye: Secondary | ICD-10-CM | POA: Diagnosis not present

## 2021-11-07 DIAGNOSIS — H25813 Combined forms of age-related cataract, bilateral: Secondary | ICD-10-CM | POA: Diagnosis not present

## 2021-11-07 DIAGNOSIS — H524 Presbyopia: Secondary | ICD-10-CM | POA: Diagnosis not present

## 2021-11-16 DIAGNOSIS — R45851 Suicidal ideations: Secondary | ICD-10-CM | POA: Diagnosis not present

## 2021-11-16 DIAGNOSIS — I10 Essential (primary) hypertension: Secondary | ICD-10-CM | POA: Diagnosis not present

## 2021-11-16 DIAGNOSIS — F32A Depression, unspecified: Secondary | ICD-10-CM | POA: Diagnosis not present

## 2021-11-16 DIAGNOSIS — R059 Cough, unspecified: Secondary | ICD-10-CM | POA: Diagnosis not present

## 2021-11-16 DIAGNOSIS — Z87820 Personal history of traumatic brain injury: Secondary | ICD-10-CM | POA: Diagnosis not present

## 2021-11-16 DIAGNOSIS — R454 Irritability and anger: Secondary | ICD-10-CM | POA: Diagnosis not present

## 2022-02-10 DIAGNOSIS — F331 Major depressive disorder, recurrent, moderate: Secondary | ICD-10-CM | POA: Diagnosis not present

## 2022-03-10 DIAGNOSIS — F331 Major depressive disorder, recurrent, moderate: Secondary | ICD-10-CM | POA: Diagnosis not present

## 2022-04-07 DIAGNOSIS — F331 Major depressive disorder, recurrent, moderate: Secondary | ICD-10-CM | POA: Diagnosis not present

## 2022-08-25 DIAGNOSIS — R051 Acute cough: Secondary | ICD-10-CM | POA: Diagnosis not present

## 2022-08-25 DIAGNOSIS — R0981 Nasal congestion: Secondary | ICD-10-CM | POA: Diagnosis not present

## 2022-08-25 DIAGNOSIS — R0602 Shortness of breath: Secondary | ICD-10-CM | POA: Diagnosis not present

## 2022-08-25 DIAGNOSIS — J209 Acute bronchitis, unspecified: Secondary | ICD-10-CM | POA: Diagnosis not present

## 2024-01-23 DIAGNOSIS — M79671 Pain in right foot: Secondary | ICD-10-CM | POA: Diagnosis not present

## 2024-01-23 DIAGNOSIS — M79661 Pain in right lower leg: Secondary | ICD-10-CM | POA: Diagnosis not present

## 2024-01-23 DIAGNOSIS — L03115 Cellulitis of right lower limb: Secondary | ICD-10-CM | POA: Diagnosis not present

## 2024-03-16 DIAGNOSIS — Z9181 History of falling: Secondary | ICD-10-CM | POA: Diagnosis not present

## 2024-03-16 DIAGNOSIS — Z0000 Encounter for general adult medical examination without abnormal findings: Secondary | ICD-10-CM | POA: Diagnosis not present

## 2024-04-14 DIAGNOSIS — Z1212 Encounter for screening for malignant neoplasm of rectum: Secondary | ICD-10-CM | POA: Diagnosis not present

## 2024-04-14 DIAGNOSIS — Z1211 Encounter for screening for malignant neoplasm of colon: Secondary | ICD-10-CM | POA: Diagnosis not present

## 2024-04-20 LAB — COLOGUARD: COLOGUARD: NEGATIVE

## 2024-09-19 NOTE — Progress Notes (Signed)
 Randy Carson                                          MRN: [id number]   09/19/2024   The [facility] Quality Team Specialist reviewed this patient medical record for the purposes of chart review for care gap closure. The following were reviewed: chart review for care gap closure-kidney health evaluation for diabetes:eGFR  and uACR.    [facility] Quality Team

## 2024-09-19 NOTE — Progress Notes (Signed)
 Randy Carson                                          MRN: [id number]   09/19/2024   The [facility] Quality Team Specialist reviewed this patient medical record for the purposes of chart review for care gap closure. The following were reviewed: abstraction for care gap closure-glycemic status assessment.    [facility] Quality Team
